# Patient Record
Sex: Female | Born: 1950 | Hispanic: No | Marital: Single | Smoking: Former smoker
Health system: Southern US, Community
[De-identification: ages and names within clinical notes are randomized; demographics above are authoritative.]

## PROBLEM LIST (undated history)

## (undated) DIAGNOSIS — I10 Essential (primary) hypertension: Secondary | ICD-10-CM

## (undated) DIAGNOSIS — K529 Noninfective gastroenteritis and colitis, unspecified: Secondary | ICD-10-CM

## (undated) DIAGNOSIS — F419 Anxiety disorder, unspecified: Secondary | ICD-10-CM

## (undated) DIAGNOSIS — E785 Hyperlipidemia, unspecified: Secondary | ICD-10-CM

## (undated) HISTORY — DX: Noninfective gastroenteritis and colitis, unspecified: K52.9

## (undated) HISTORY — DX: Hyperlipidemia, unspecified: E78.5

## (undated) HISTORY — DX: Anxiety disorder, unspecified: F41.9

## (undated) HISTORY — PX: COLONOSCOPY: SHX174

---

## 1997-09-01 ENCOUNTER — Other Ambulatory Visit: Admission: RE | Admit: 1997-09-01 | Discharge: 1997-09-01 | Payer: Self-pay | Admitting: Obstetrics and Gynecology

## 1998-04-09 ENCOUNTER — Emergency Department (HOSPITAL_COMMUNITY): Admission: EM | Admit: 1998-04-09 | Discharge: 1998-04-09 | Payer: Self-pay | Admitting: Emergency Medicine

## 1998-09-17 ENCOUNTER — Other Ambulatory Visit: Admission: RE | Admit: 1998-09-17 | Discharge: 1998-09-17 | Payer: Self-pay | Admitting: Obstetrics and Gynecology

## 2001-01-04 ENCOUNTER — Other Ambulatory Visit: Admission: RE | Admit: 2001-01-04 | Discharge: 2001-01-04 | Payer: Self-pay | Admitting: Obstetrics and Gynecology

## 2002-10-12 ENCOUNTER — Other Ambulatory Visit: Admission: RE | Admit: 2002-10-12 | Discharge: 2002-10-12 | Payer: Self-pay | Admitting: Obstetrics and Gynecology

## 2004-07-18 ENCOUNTER — Other Ambulatory Visit: Admission: RE | Admit: 2004-07-18 | Discharge: 2004-07-18 | Payer: Self-pay | Admitting: Obstetrics and Gynecology

## 2004-12-02 ENCOUNTER — Encounter (INDEPENDENT_AMBULATORY_CARE_PROVIDER_SITE_OTHER): Payer: Self-pay | Admitting: Specialist

## 2004-12-02 ENCOUNTER — Ambulatory Visit (HOSPITAL_COMMUNITY): Admission: RE | Admit: 2004-12-02 | Discharge: 2004-12-02 | Payer: Self-pay | Admitting: Gastroenterology

## 2004-12-02 DIAGNOSIS — K529 Noninfective gastroenteritis and colitis, unspecified: Secondary | ICD-10-CM

## 2004-12-02 HISTORY — DX: Noninfective gastroenteritis and colitis, unspecified: K52.9

## 2007-07-15 ENCOUNTER — Emergency Department (HOSPITAL_COMMUNITY): Admission: EM | Admit: 2007-07-15 | Discharge: 2007-07-15 | Payer: Self-pay | Admitting: Emergency Medicine

## 2008-10-13 ENCOUNTER — Emergency Department (HOSPITAL_COMMUNITY): Admission: EM | Admit: 2008-10-13 | Discharge: 2008-10-13 | Payer: Self-pay | Admitting: Family Medicine

## 2010-06-09 LAB — POCT URINALYSIS DIP (DEVICE)
Bilirubin Urine: NEGATIVE
Glucose, UA: NEGATIVE mg/dL
Ketones, ur: NEGATIVE mg/dL
Nitrite: POSITIVE — AB
Specific Gravity, Urine: 1.01 (ref 1.005–1.030)
pH: 6.5 (ref 5.0–8.0)

## 2010-06-09 LAB — WET PREP, GENITAL
Clue Cells Wet Prep HPF POC: NONE SEEN
Trich, Wet Prep: NONE SEEN
Yeast Wet Prep HPF POC: NONE SEEN

## 2010-06-09 LAB — GC/CHLAMYDIA PROBE AMP, GENITAL: Chlamydia, DNA Probe: NEGATIVE

## 2010-07-19 NOTE — Op Note (Signed)
Lindsey Krause, Lindsey Krause             ACCOUNT NO.:  [id number]   MEDICAL RECORD NO.:  [id number]          PATIENT TYPE:  AMB   LOCATION:  ENDO                         FACILITY:  [facility]   PHYSICIAN:  Anselmo Rod, M.D.  DATE OF BIRTH:  18-Apr-1950   DATE OF PROCEDURE:  12/02/2004  DATE OF DISCHARGE:                                 OPERATIVE REPORT   PROCEDURE:  Colonoscopy with cold biopsies x8.   ENDOSCOPIST:  Anselmo Rod, M.D.   INSTRUMENT USED:  Olympus video colonoscope.   INDICATIONS FOR PROCEDURE:  A 60 year old African-American female with a  history of chronic constipation and family history of colon cancer  undergoing a screening colonoscopy, rule out colonic polyps, masses, etc.   PREPROCEDURE PREPARATION:  Informed consent was procured from the patient.  The patient was fasted for 8 hours prior to the procedure and prepped with a  bottle of magnesium citrate and a gallon of GoLYTELY the night prior to the  procedure.  Risks and benefits of the procedure including a 10% miss rate of  cancer and polyp was discussed with the patient as well.   PREPROCEDURE PHYSICAL EXAMINATION:  VITAL SIGNS:  Stable.  NECK:  Supple.  CHEST:  Clear to auscultation.  S1 and S2 regular.  ABDOMEN:  Soft with normal bowel sounds.   DESCRIPTION OF PROCEDURE:  The patient was placed in the left lateral  decubitus position and sedated with 100 mg of Demerol and 7.5 mg of Versed  in slow incremental doses.  Once the patient was adequately sedated and  maintained on low flow oxygen and continuous cardiac monitoring.  The  Olympus video colonoscope was advanced from the rectum to the cecum.  The  appendiceal orifice and ileocecal valve are visualized and photographed.  Erosions were noted in the cecum with some nodular changes in the  surrounding mucosa.  These were biopsied for pathology.  The terminal ileum  appeared normal and without lesions.  There was some loss of vascular  pattern with  bleeding noted around in the rectosigmoid colon at 15 cm.  Biopsies were done to rule out dysplasia versus malignancy.  There was no  evidence of diverticulosis.  Retroflexion in the rectum revealed no  abnormalities.  The patient tolerated the procedure well without immediate  complications.   IMPRESSION:  1.Loss of vascular pattern with some fresh blood noted at 15 cm  for reasons not clear to me.  Biopsies were done at this area.  2.Erosions biopsied from the cecum.  3.Normal to the terminal ileum.  4.No masses, polyps, or diverticula seen.   RECOMMENDATIONS:  Await pathology results.  Avoid all nonsteroidals for now.  Outpatient follow-up in the next 2 weeks for further recommendations.      Anselmo Rod, M.D.  Electronically Signed     JNM/MEDQ  D:  12/02/2004  T:  12/02/2004  Job:  [id number]   cc:   Candyce Churn. Allyne Gee, M.D.  Fax: [phone]   Janine Limbo, M.D.  Fax: [phone]

## 2012-08-30 DIAGNOSIS — E049 Nontoxic goiter, unspecified: Secondary | ICD-10-CM | POA: Insufficient documentation

## 2013-07-26 DIAGNOSIS — I10 Essential (primary) hypertension: Secondary | ICD-10-CM | POA: Insufficient documentation

## 2014-09-12 DIAGNOSIS — E559 Vitamin D deficiency, unspecified: Secondary | ICD-10-CM | POA: Insufficient documentation

## 2014-09-12 DIAGNOSIS — Z0000 Encounter for general adult medical examination without abnormal findings: Secondary | ICD-10-CM | POA: Insufficient documentation

## 2015-04-04 ENCOUNTER — Encounter (HOSPITAL_COMMUNITY): Payer: Self-pay | Admitting: *Deleted

## 2015-04-04 ENCOUNTER — Emergency Department (HOSPITAL_COMMUNITY): Payer: BC Managed Care – PPO

## 2015-04-04 ENCOUNTER — Emergency Department (HOSPITAL_COMMUNITY)
Admission: EM | Admit: 2015-04-04 | Discharge: 2015-04-05 | Discharge status: Against Medical Advice | Payer: BC Managed Care – PPO | Attending: Emergency Medicine | Admitting: Emergency Medicine

## 2015-04-04 DIAGNOSIS — R51 Headache: Secondary | ICD-10-CM | POA: Diagnosis not present

## 2015-04-04 DIAGNOSIS — Z973 Presence of spectacles and contact lenses: Secondary | ICD-10-CM | POA: Insufficient documentation

## 2015-04-04 DIAGNOSIS — H578 Other specified disorders of eye and adnexa: Secondary | ICD-10-CM | POA: Diagnosis present

## 2015-04-04 DIAGNOSIS — I10 Essential (primary) hypertension: Secondary | ICD-10-CM | POA: Insufficient documentation

## 2015-04-04 DIAGNOSIS — H5462 Unqualified visual loss, left eye, normal vision right eye: Secondary | ICD-10-CM | POA: Insufficient documentation

## 2015-04-04 HISTORY — DX: Essential (primary) hypertension: I10

## 2015-04-04 LAB — COMPREHENSIVE METABOLIC PANEL
ALBUMIN: 3.8 g/dL (ref 3.5–5.0)
ALT: 17 U/L (ref 14–54)
AST: 21 U/L (ref 15–41)
Alkaline Phosphatase: 78 U/L (ref 38–126)
Anion gap: 11 (ref 5–15)
BILIRUBIN TOTAL: 0.2 mg/dL — AB (ref 0.3–1.2)
BUN: 12 mg/dL (ref 6–20)
CHLORIDE: 102 mmol/L (ref 101–111)
CO2: 28 mmol/L (ref 22–32)
CREATININE: 0.91 mg/dL (ref 0.44–1.00)
Calcium: 9.8 mg/dL (ref 8.9–10.3)
GFR calc Af Amer: >60 mL/min (ref >60)
GLUCOSE: 109 mg/dL — AB (ref 65–99)
POTASSIUM: 4 mmol/L (ref 3.5–5.1)
Sodium: 141 mmol/L (ref 135–145)
Total Protein: 7.2 g/dL (ref 6.5–8.1)

## 2015-04-04 LAB — I-STAT CHEM 8, ED
BUN: 15 mg/dL (ref 6–20)
CHLORIDE: 103 mmol/L (ref 101–111)
CREATININE: 0.9 mg/dL (ref 0.44–1.00)
Calcium, Ion: 1.18 mmol/L (ref 1.13–1.30)
Glucose, Bld: 105 mg/dL — ABNORMAL HIGH (ref 65–99)
HEMATOCRIT: 42 % (ref 36.0–46.0)
Hemoglobin: 14.3 g/dL (ref 12.0–15.0)
Potassium: 3.9 mmol/L (ref 3.5–5.1)
SODIUM: 140 mmol/L (ref 135–145)
TCO2: 26 mmol/L (ref 0–100)

## 2015-04-04 LAB — PROTIME-INR
INR: 1.03 (ref 0.00–1.49)
PROTHROMBIN TIME: 13.7 s (ref 11.6–15.2)

## 2015-04-04 LAB — CBG MONITORING, ED: Glucose-Capillary: 88 mg/dL (ref 65–99)

## 2015-04-04 LAB — CBC
HEMATOCRIT: 39.1 % (ref 36.0–46.0)
HEMOGLOBIN: 12.9 g/dL (ref 12.0–15.0)
MCH: 27.9 pg (ref 26.0–34.0)
MCHC: 33 g/dL (ref 30.0–36.0)
MCV: 84.4 fL (ref 78.0–100.0)
Platelets: 233 10*3/uL (ref 150–400)
RBC: 4.63 MIL/uL (ref 3.87–5.11)
RDW: 12.5 % (ref 11.5–15.5)
WBC: 7 10*3/uL (ref 4.0–10.5)

## 2015-04-04 LAB — I-STAT TROPONIN, ED: TROPONIN I, POC: 0 ng/mL (ref 0.00–0.08)

## 2015-04-04 LAB — DIFFERENTIAL
BASOS ABS: 0 10*3/uL (ref 0.0–0.1)
BASOS PCT: 0 %
EOS ABS: 0.3 10*3/uL (ref 0.0–0.7)
Eosinophils Relative: 4 %
LYMPHS ABS: 3.1 10*3/uL (ref 0.7–4.0)
Lymphocytes Relative: 44 %
MONOS PCT: 4 %
Monocytes Absolute: 0.3 10*3/uL (ref 0.1–1.0)
NEUTROS ABS: 3.3 10*3/uL (ref 1.7–7.7)
NEUTROS PCT: 48 %

## 2015-04-04 LAB — APTT: APTT: 33 s (ref 24–37)

## 2015-04-04 LAB — SEDIMENTATION RATE: Sed Rate: 33 mm/hr — ABNORMAL HIGH (ref 0–22)

## 2015-04-04 NOTE — ED Provider Notes (Signed)
By signing my name below, I, Rohini Rajnarayanan, attest that this documentation has been prepared under the direction and in the presence of [place], DO Electronically Signed: Evonnie Dawes, ED Scribe 04/04/2015 at 11:17 PM.  TIME SEEN: 1:05 AM  CHIEF COMPLAINT:  Chief Complaint  Patient presents with  . Eye Problem    HPI: HPI Comments: Lindsey Krause is a 65 y.o. female with a PMhx of HTN, who presents to the Emergency Department complaining of intermittent blindness in the left eye which began around 6pm today. The last episode was about 8:30PM. Episode began as blurriness and then complete loss of vision in one eye. Each episode lasts around 10 minutes each. Pt denies any similar prior hx. Pt has no numbness or weakness on one side of the body. She reported to triage nursing staff that she did have some mild pain over the left temple but denies this to me. Pt has no pmhx of temporal arteritis, autoimmune diseases, or TIA/CVA.  Pt denies any fatigue or pain in the jaw while eating. Pt denies any SOB or CP. Pt has not seen an opthalmologist in several years. Pt wears glasses.  Pt does not smoke. Pt takes no medication for HTN.  Denies headache currently. No eye pain. No tearing. No trauma to the eye or head. Not on anticoagulation. No vomiting.   ROS: See HPI Constitutional: no fever  Eyes: no drainage, +vision changes ENT: no runny nose  Cardiovascular:  no chest pain  Resp: no SOB  GI: no vomiting GU: no dysuria Integumentary: no rash  Allergy: no hives  Musculoskeletal: no leg swelling  Neurological: no slurred speech ROS otherwise negative  PAST MEDICAL HISTORY/PAST SURGICAL HISTORY:  Past Medical History  Diagnosis Date  . Hypertension     MEDICATIONS:  Prior to Admission medications   Not on File    ALLERGIES:  Allergies not on file  SOCIAL HISTORY:  Social History  Substance Use Topics  . Smoking status: Never Smoker   . Smokeless tobacco: Not on  file  . Alcohol Use: No    FAMILY HISTORY: No family history on file.  EXAM:  BP 165/83 mmHg  Pulse 60  Temp(Src) 97.7 F (36.5 C) (Oral)  Resp 18  Ht _0  (1.626 m)  Wt 150 lb (68.04 kg)  BMI 25.73 kg/m2  SpO2 100% CONSTITUTIONAL: Alert and oriented and responds appropriately to questions. Well-appearing; well-nourished HEAD: Normocephalic, no Tenderness over the left temporal artery, no erythema or warmth.  EYES: Conjunctivae clear, PERRL, EOM intact; fundoscopic exam limited due to patient cooperation, no hyphema or hypopyon, no subconjunctival hemorrhage, no eye discharge, normal visual fields ENT: normal nose; no rhinorrhea; moist mucous membranes; pharynx without lesions noted NECK: Supple, no meningismus, no LAD  CARD: RRR; S1 and S2 appreciated; no murmurs, no clicks, no rubs, no gallops RESP: Normal chest excursion without splinting or tachypnea; breath sounds clear and equal bilaterally; no wheezes, no rhonchi, no rales, no hypoxia or respiratory distress, speaking full sentences ABD/GI: Normal bowel sounds; non-distended; soft, non-tender, no rebound, no guarding, no peritoneal signs BACK:  The back appears normal and is non-tender to palpation, there is no CVA tenderness EXT: Normal ROM in all joints; non-tender to palpation; no edema; normal capillary refill; no cyanosis, no calf tenderness or swelling    SKIN: Normal color for age and race; warm NEURO: Moves all extremities equally, sensation to light touch intact diffusely, cranial nerves II through XII intact. Strenght 5/5 in all  4 extremities. Normal gait. No dysarthria or aphasia. PSYCH: The patient's mood and manner are appropriate. Grooming and personal hygiene are appropriate.   MEDICAL DECISION MAKING: Patient here with complaints of vision loss in the left eye twice. Vision is now back to normal. No other neurologic deficits. Patient's labs ordered in triage had been unremarkable except for mildly elevated ESR.  CT of her head shows no acute intracranial abnormality. Discussed with patient that I'm concerned for amaurosis fugax, less likely temporal arteritis, TIA. Patient is very upset over her weight. Have apologized multiple times. Patient is difficult to redirect. She agrees to wait until I'm able to discuss her case with Dr. Nicole Kindred, on-call neuro hospitalist.  ED PROGRESS: Discussed with Dr. Nicole Kindred who recommend CT angios of patient's head and cervical spine to look at her current arteries. He recommends starting patient on aspirin. She refuses to have any more workup done in the emergency department and states that she wants to see her primary care physician. Refuses to start taking aspirin. I have had a long discussion with patient that I feel that she needs further workup emergently again patient refuses. She is oriented and has capacity to make this decision for herself. She does not appear intoxicated. I discussed with patient that she could end up with long-term disability and even death without further workup. Have recommended she return to emergency department immediately if her symptoms return or she changes her mind. She does have a primary care physician for follow-up. We'll have her sign out [place]. Discussed return precautions. She verbalized understanding.    EKG Interpretation  Date/Time:  Wednesday April 04 2015 22:14:05 EST Ventricular Rate:  66 PR Interval:  154 QRS Duration: 100 QT Interval:  424 QTC Calculation: 444 R Axis:   88 Text Interpretation:   Poor data quality, interpretation may be adversely affected Sinus rhythm Incomplete right bundle branch block Borderline ECG No old tracing to compare Confirmed by [facility]  MD, DAVID ([id number]) on 04/04/2015 10:25:23 PM         I personally performed the services described in this documentation, which was scribed in my presence. The recorded information has been reviewed and is accurate.    [place],  DO 04/05/15 [phone]

## 2015-04-04 NOTE — ED Notes (Signed)
CBG was 88

## 2015-04-04 NOTE — ED Notes (Signed)
Pt refused lab work.

## 2015-04-04 NOTE — ED Notes (Addendum)
Pt says that around 6pm and again at around 9pm, lasting for about 10 minutes, pt says that through her left eye her vision "fades out". She says she see's light, outlines, then "nothing". Now her vision has returned to normal. She c/o a small amount of pain over the left temporal area (now subsided)

## 2015-04-05 NOTE — ED Notes (Signed)
Dr. Leonides Schanz at bedside speaking with pt.

## 2015-04-06 DIAGNOSIS — H53129 Transient visual loss, unspecified eye: Secondary | ICD-10-CM | POA: Insufficient documentation

## 2015-06-12 DIAGNOSIS — H40003 Preglaucoma, unspecified, bilateral: Secondary | ICD-10-CM | POA: Insufficient documentation

## 2015-06-14 DIAGNOSIS — F419 Anxiety disorder, unspecified: Secondary | ICD-10-CM | POA: Insufficient documentation

## 2016-01-14 DIAGNOSIS — R1032 Left lower quadrant pain: Secondary | ICD-10-CM | POA: Insufficient documentation

## 2017-02-24 IMAGING — CT CT HEAD W/O CM
2 series · 16 of 30 positions shown, 20 images · non-contrast
Comparison: None.

CLINICAL DATA: Two episodes of transient vision loss in the left
eye tonight.

EXAM:
CT HEAD WITHOUT CONTRAST
TECHNIQUE: Contiguous axial images were obtained from the base of the skull
through the vertex without intravenous contrast.

[Series 201: head w/o, idose (1) · axial · non-contrast · 0.41mm/px · z∈[+124,+254]mm · 13 of 32 slices shown, 17 images]
[im 3/32  brain]
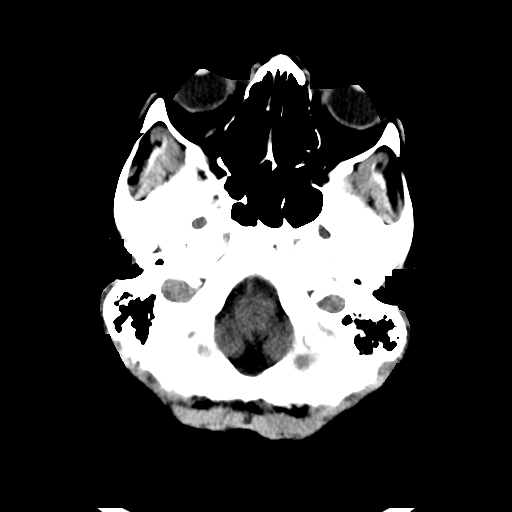
[im 3/32  bone]
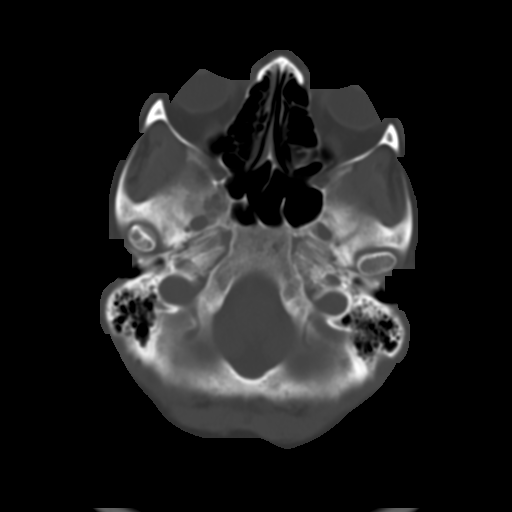
[im 5/32  brain]
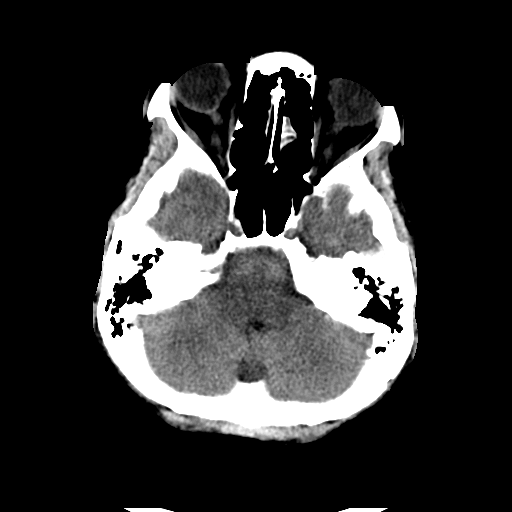
[im 7/32  brain]
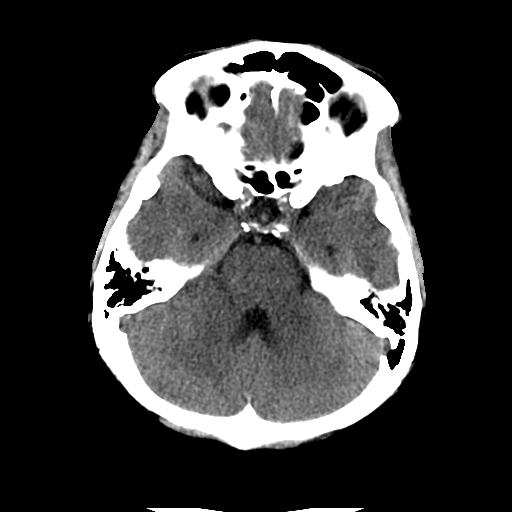
[im 9/32  brain]
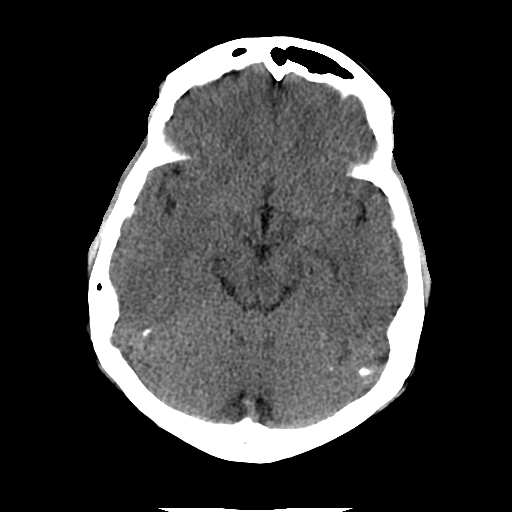
[im 12/32  brain]
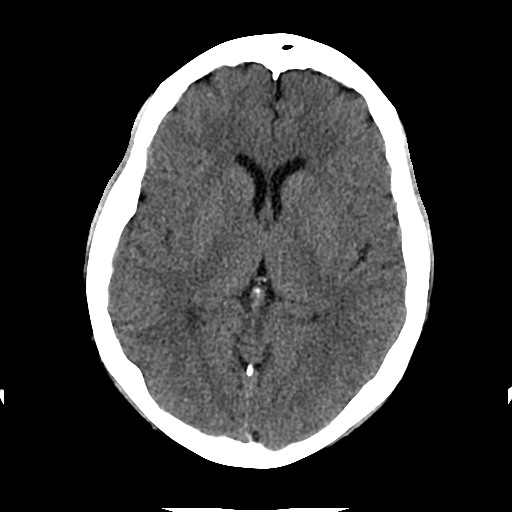
[im 12/32  bone]
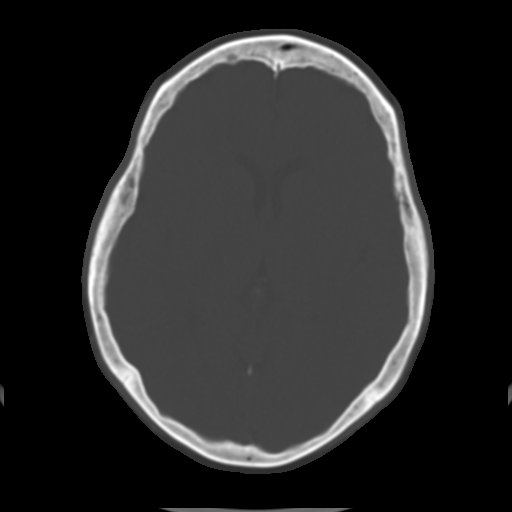
[im 14/32  brain]
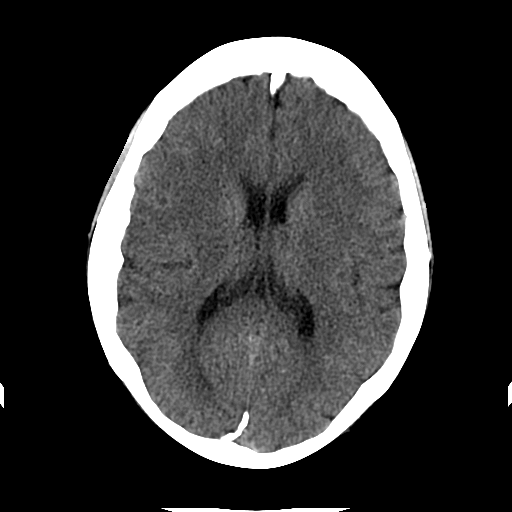
[im 16/32  brain]
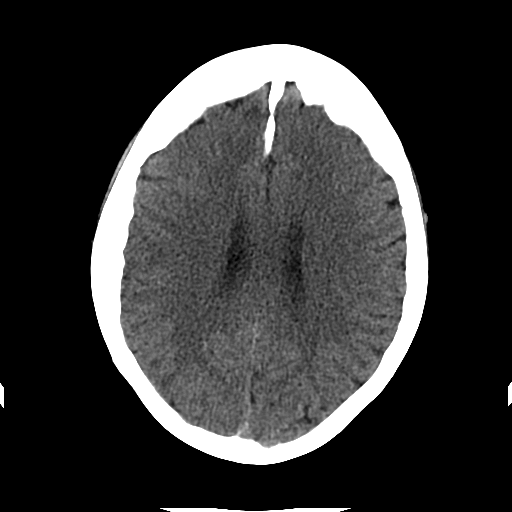
[im 18/32  brain]
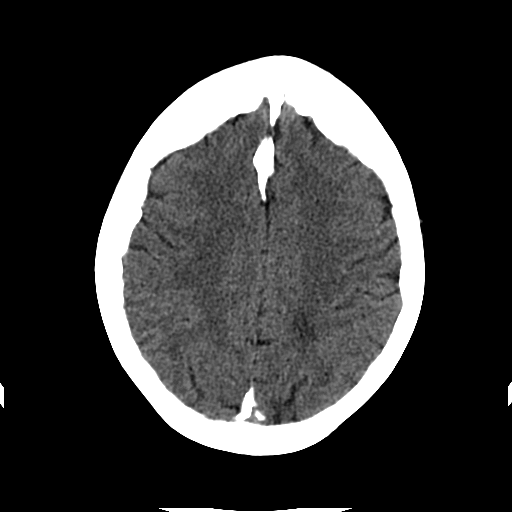
[im 20/32  brain]
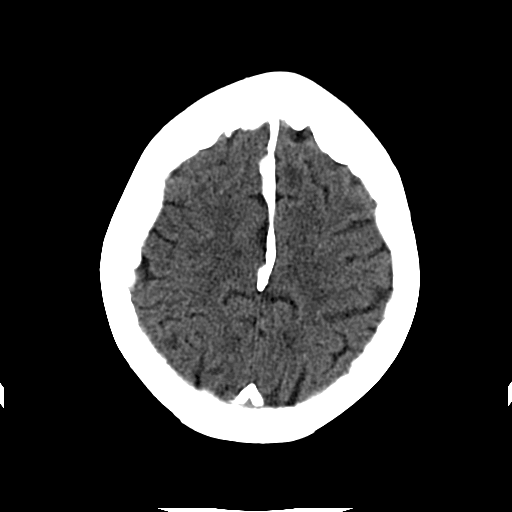
[im 20/32  bone]
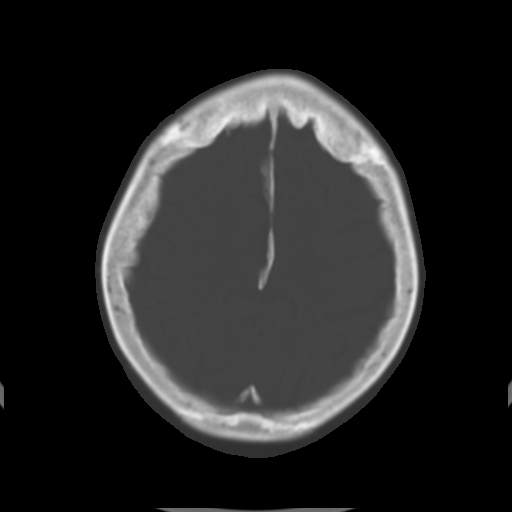
[im 23/32  brain]
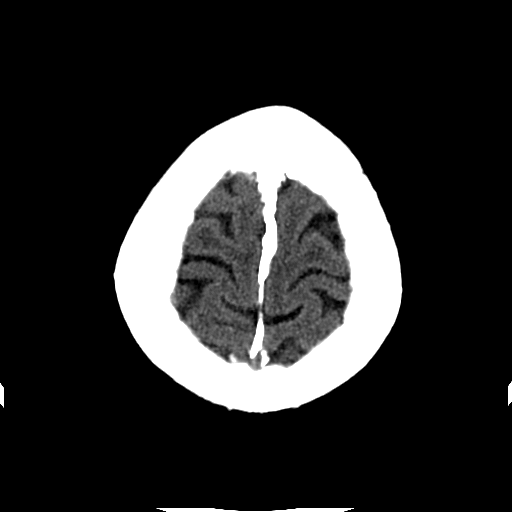
[im 25/32  brain]
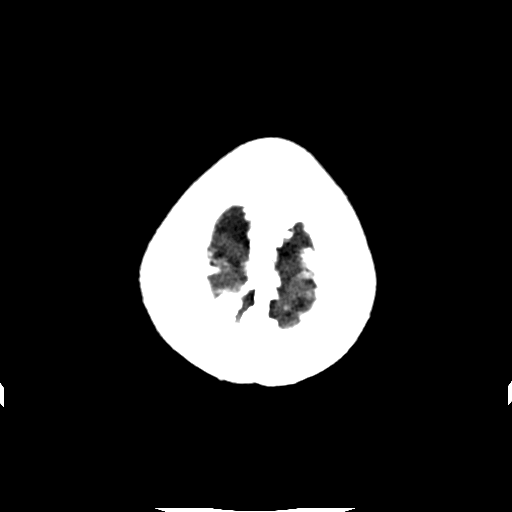
[im 27/32  brain]
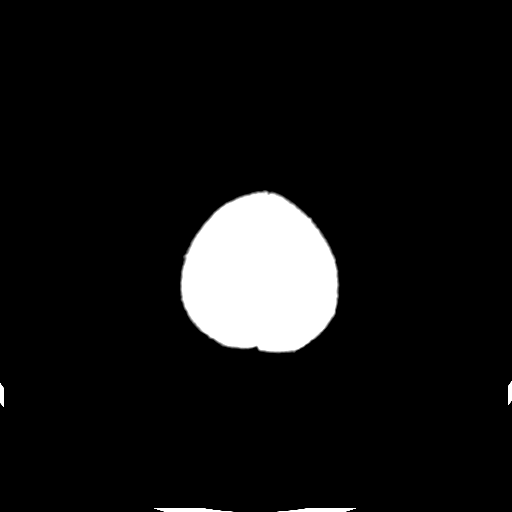
[im 29/32  brain]
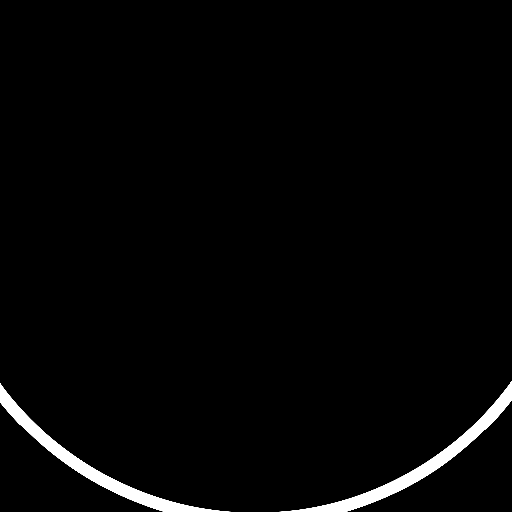
[im 29/32  bone]
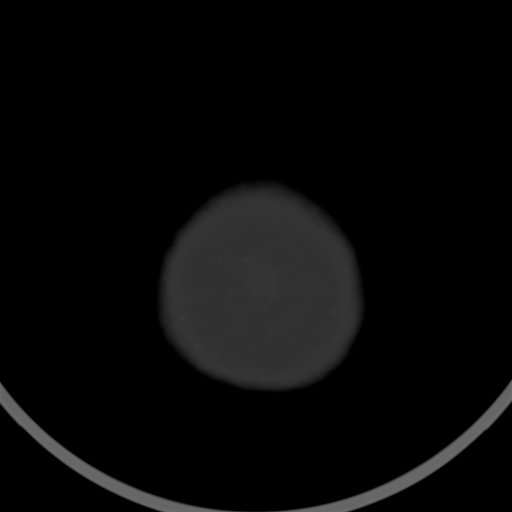

[Series 202: head w/o bone, idose (1) · axial · non-contrast · 0.41mm/px · z∈[+124,+169]mm · 3 of 32 slices shown]
[im 3/32  bone]
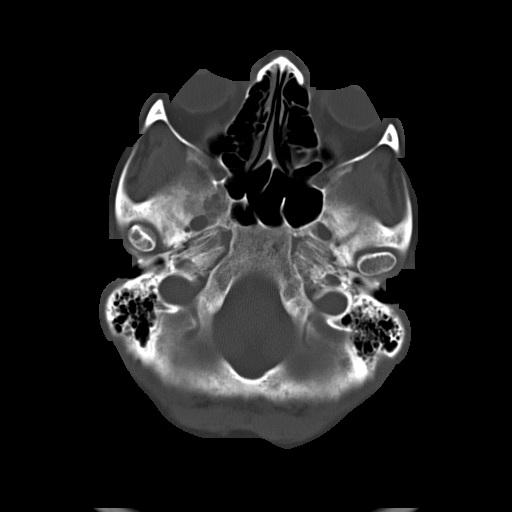
[im 7/32  bone]
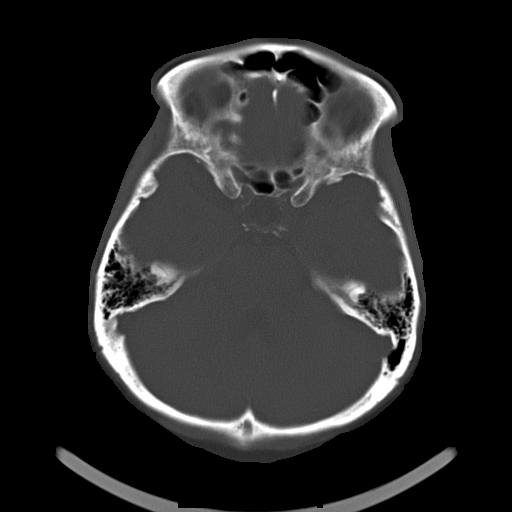
[im 12/32  bone]
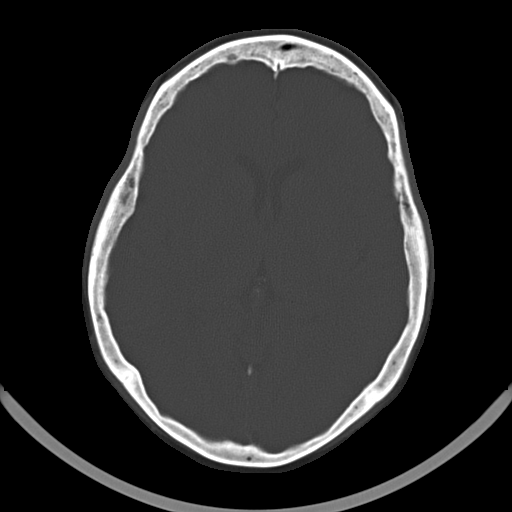

[16 of 30 positions shown; findings below may reference images not displayed]

FINDINGS: No intracranial hemorrhage, mass effect, or midline shift. No
hydrocephalus. The basilar cisterns are patent. Mild chronic small
vessel ischemia. No evidence of territorial infarct. No intracranial
fluid collection. Falx and dural-based calcification along the
tentorium. Calvarium is intact. Included paranasal sinuses and
mastoid air cells are well aerated.
IMPRESSION: No acute intracranial abnormality.

## 2017-03-04 DIAGNOSIS — D259 Leiomyoma of uterus, unspecified: Secondary | ICD-10-CM | POA: Insufficient documentation

## 2017-03-06 ENCOUNTER — Other Ambulatory Visit: Payer: Self-pay | Admitting: Family Medicine

## 2017-03-06 DIAGNOSIS — E010 Iodine-deficiency related diffuse (endemic) goiter: Secondary | ICD-10-CM

## 2017-03-26 ENCOUNTER — Ambulatory Visit
Admission: RE | Admit: 2017-03-26 | Discharge: 2017-03-26 | Disposition: A | Discharge status: Home / Self Care | Payer: BC Managed Care – PPO | Source: Ambulatory Visit | Attending: Family Medicine | Admitting: Family Medicine

## 2017-03-26 DIAGNOSIS — E010 Iodine-deficiency related diffuse (endemic) goiter: Secondary | ICD-10-CM

## 2017-05-07 ENCOUNTER — Encounter: Payer: Self-pay | Admitting: Family Medicine

## 2017-08-18 ENCOUNTER — Encounter: Payer: Self-pay | Admitting: Endocrinology

## 2017-08-20 ENCOUNTER — Encounter: Payer: Self-pay | Admitting: Internal Medicine

## 2017-09-17 DIAGNOSIS — R0602 Shortness of breath: Secondary | ICD-10-CM | POA: Insufficient documentation

## 2017-10-20 ENCOUNTER — Ambulatory Visit: Payer: BC Managed Care – PPO | Admitting: Internal Medicine

## 2018-02-01 ENCOUNTER — Encounter: Payer: Self-pay | Admitting: Internal Medicine

## 2018-03-04 ENCOUNTER — Encounter: Payer: Self-pay | Admitting: *Deleted

## 2018-03-12 ENCOUNTER — Ambulatory Visit: Payer: BC Managed Care – PPO | Admitting: Internal Medicine

## 2018-03-12 ENCOUNTER — Encounter

## 2018-03-12 ENCOUNTER — Telehealth: Payer: Self-pay | Admitting: *Deleted

## 2018-03-12 ENCOUNTER — Encounter: Payer: Self-pay | Admitting: Internal Medicine

## 2018-03-12 VITALS — BP 138/70 | HR 64 | Ht 64.0 in | Wt 161.6 lb

## 2018-03-12 DIAGNOSIS — Z1211 Encounter for screening for malignant neoplasm of colon: Secondary | ICD-10-CM

## 2018-03-12 DIAGNOSIS — R109 Unspecified abdominal pain: Secondary | ICD-10-CM | POA: Diagnosis not present

## 2018-03-12 MED ORDER — HYOSCYAMINE SULFATE 0.125 MG SL SUBL: 0.1250 mg | SUBLINGUAL_TABLET | Freq: Four times a day (QID) | SUBLINGUAL | 1 refills | Status: DC | PRN

## 2018-03-12 MED ORDER — SUPREP BOWEL PREP KIT 17.5-3.13-1.6 GM/177ML PO SOLN: 1.0000 | ORAL | 0 refills | Status: DC

## 2018-03-12 NOTE — Patient Instructions (Signed)
You have been scheduled for a colonoscopy. Please follow written instructions given to you at your visit today.  Please pick up your prep supplies at the pharmacy within the next 1-3 days. If you use inhalers (even only as needed), please bring them with you on the day of your procedure. Your physician has requested that you go to www.startemmi.com and enter the access code given to you at your visit today. This web site gives a general overview about your procedure. However, you should still follow specific instructions given to you by our office regarding your preparation for the procedure.  If you are age 15 or older, your body mass index should be between 23-30. Your Body mass index is 27.74 kg/m. If this is out of the aforementioned range listed, please consider follow up with your Primary Care Provider.  If you are age 68 or younger, your body mass index should be between 19-25. Your Body mass index is 27.74 kg/m. If this is out of the aformentioned range listed, please consider follow up with your Primary Care Provider.

## 2018-03-12 NOTE — Progress Notes (Signed)
Patient ID: Lindsey Krause, female   DOB: 06/03/1950, 68 y.o.   MRN: [id number] HPI: Lindsey Krause is a 68 year old female with a history of hypertension who is seen in consultation at the request of Dr. Luciana Axe to evaluate left-sided abdominal pain but also to consider colon cancer screening.  She is here alone today.  This is our first visit together.  She reports that she has been having episodes of left-sided, particularly lower abdominal pain over the last 3 years.  Initially this was associated with some urinary issues and she was diagnosed with a urinary tract infection.  She was treated with antibiotics and it cleared up both her left-sided abdominal pain and her urinary tract infection.  Again sometime later symptoms restarted and happened to correspond to a visit with her gynecologist.  Diagnosed again with UTI, received antibiotics and they improved.  6 months ago left-sided abdominal pain again she had some leftover antibiotics which she took, without being a complete course and it helped.  These episodes seem to occur every 3 to 6 months.  Cycles can last for as long as a month.  Bowel movements have been regular though prior to episodes at times have been small and harder.  No blood in her stool or melena.  Most recently bowels are regular occurring once per day.  No diarrhea or constipation.  Her appetite has been good and weight stable.  She was having some issues with heartburn on occasion but this is been better when she eliminated coffee from her diet.  Episodes can be associated with a low grade nausea without vomiting.  No dysphagia or odynophagia.  She feels that she is improving currently from what she would consider a mild episode.  She has not had any urinary symptoms of late and did not take antibiotics recently.  She does take a daily fiber supplement and a probiotic and an herbal preparation containing the word "claw".  She also uses external heat which she feels strongly helps the  discomfort.  She had a negative Cologuard 3 years ago.  Remotely she had a colonoscopy performed by Dr. Collene Mares on 12/02/2004.  I reviewed this procedure including pathology personally and also with the patient.  Colonoscopy showed erosions in the cecum with nodular change which was biopsied.  A normal terminal ileum without lesions.  Loss of vascular pattern with bleeding at the rectosigmoid colon at 15 cm.  This was biopsied.  There was no diverticulosis seen in her retroflexion views from the rectum were normal.  Pathology = cecum focal active colitis.  No granulomas or crypt distortion.  Differential includes resolving infectious colitis, Crohn's and NSAID.  Rectosigmoid biopsies hyperemia with microhemorrhages.  In the rectosigmoid there was no active inflammation or chronic changes of granuloma.  Nonspecific scope, query scope trauma or bowel preparation.  Past Medical History:  Diagnosis Date  . Anxiety   . Colitis 12/02/2004   Dr Collene Mares "focal active colitis"  . Hypertension     Past Surgical History:  Procedure Laterality Date  . COLONOSCOPY      Outpatient Medications Prior to Visit  Medication Sig Dispense Refill  . amlodipine-benazepril (LOTREL) 2.5-10 MG capsule Take 1 capsule by mouth daily.    . cholecalciferol (VITAMIN D) 1000 units tablet Take 1,000 Units by mouth daily.     No facility-administered medications prior to visit.     Allergies  Allergen Reactions  . Aspirin Swelling  . Codeine Swelling    Family History  Problem Relation  Age of Onset  . Heart disease Father   . Diabetes Sister   . Diabetes Maternal Grandmother   . Colon cancer Cousin   . Breast cancer Other     Social History   Tobacco Use  . Smoking status: Never Smoker  . Smokeless tobacco: Never Used  Substance Use Topics  . Alcohol use: No  . Drug use: No    ROS: As per history of present illness, otherwise negative  BP 138/70   Pulse 64   Ht 5\' 4"  (1.626 m)   Wt 161 lb 9.6 oz  (73.3 kg)   SpO2 100%   BMI 27.74 kg/m  Constitutional: Well-developed and well-nourished. No distress. HEENT: Normocephalic and atraumatic.  No scleral icterus. Neck: Neck supple. Trachea midline. Cardiovascular: Normal rate, regular rhythm and intact distal pulses. No M/R/G Pulmonary/chest: Effort normal and breath sounds normal. No wheezing, rales or rhonchi. Abdominal: Soft, mild left lower tenderness without rebound or guarding, nondistended. Bowel sounds active throughout. There are no masses palpable. No hepatosplenomegaly. Extremities: no clubbing, cyanosis, or edema Neurological: Alert and oriented to person place and time. Psychiatric: Normal mood and affect. Behavior is normal.  RELEVANT LABS AND IMAGING: CBC    Component Value Date/Time   WBC 7.0 04/04/2015 2221   RBC 4.63 04/04/2015 2221   HGB 14.3 04/04/2015 2222   HCT 42.0 04/04/2015 2222   PLT 233 04/04/2015 2221   MCV 84.4 04/04/2015 2221   MCH 27.9 04/04/2015 2221   MCHC 33.0 04/04/2015 2221   RDW 12.5 04/04/2015 2221   LYMPHSABS 3.1 04/04/2015 2221   MONOABS 0.3 04/04/2015 2221   EOSABS 0.3 04/04/2015 2221   BASOSABS 0.0 04/04/2015 2221    CMP     Component Value Date/Time   NA 140 04/04/2015 2222   K 3.9 04/04/2015 2222   CL 103 04/04/2015 2222   CO2 28 04/04/2015 2221   GLUCOSE 105 (H) 04/04/2015 2222   BUN 15 04/04/2015 2222   CREATININE 0.90 04/04/2015 2222   CALCIUM 9.8 04/04/2015 2221   PROT 7.2 04/04/2015 2221   ALBUMIN 3.8 04/04/2015 2221   AST 21 04/04/2015 2221   ALT 17 04/04/2015 2221   ALKPHOS 78 04/04/2015 2221   BILITOT 0.2 (L) 04/04/2015 2221   GFRNONAA >60 04/04/2015 2221   GFRAA >60 04/04/2015 2221    ASSESSMENT/PLAN: 68 year old female with a history of hypertension who is seen in consultation at the request of Dr. Luciana Axe to evaluate left-sided abdominal pain but also to consider colon cancer screening.  1. CRC screening --last Cologuard -3 years ago.  Colorectal cancer  screening due at this time.  I recommended proceeding with colonoscopy.  We discussed the risk, benefits and alternatives and she is agreeable and wishes to proceed.  2.  Intermittent/episodic left lower quadrant abdominal pain --is interesting and notable that symptoms seem to improve with antibiotics.  This raises suspicion for low level diverticular inflammation versus infectious etiology.  The findings from colonoscopy in 2008 are also notable given erythema seen at the rectosigmoid and also cecum.  This is another reason to repeat the colonoscopy to further evaluate the symptoms.  Exam was not significantly tender to suggest active diverticulitis.  I recommend she continue her fiber preparation to help promote regular and complete bowel movement.  Levsin 0.125 every 6 hours can be used as needed for crampy abdominal pain.     BT:DVVO, Dola Factor, [place], [place] [address]

## 2018-03-12 NOTE — Addendum Note (Signed)
Addended by: Larina Bras on: 03/12/2018 10:34 AM   Modules accepted: Orders

## 2018-03-12 NOTE — Telephone Encounter (Signed)
Patient asked if Dr Hilarie Fredrickson had any additional suggestions regarding things to help with her abdominal discomfort prior to her colonoscopy 04/08/2018. Dr Hilarie Fredrickson states he would like patient to continue taking her fiber and has also offered levsin SL 0.125 mg  Every 6 hours as needed if she would like to try this. Patient indicates that she would like the levsin sent to the pharmacy and will take it if she feels she needs it. Rx has been sent.

## 2018-03-25 ENCOUNTER — Encounter: Payer: Self-pay | Admitting: Internal Medicine

## 2018-04-08 ENCOUNTER — Encounter: Payer: BC Managed Care – PPO | Admitting: Internal Medicine

## 2018-04-27 ENCOUNTER — Other Ambulatory Visit: Payer: Self-pay

## 2018-04-27 ENCOUNTER — Telehealth: Payer: Self-pay | Admitting: Internal Medicine

## 2018-04-27 NOTE — Telephone Encounter (Signed)
Pt is following back up on previous msg. has not heard anything back and has to take it tomorrow.

## 2018-04-27 NOTE — Telephone Encounter (Signed)
Attempted to reach patient. Unfortunately, I got no answer and voicemail is full at this time. I will attempt to reach her again tomorrow morning.  Also, the only allergies she listed with [place] were aspirin and codeine, both of which cause swelling. We will need to go through her allergy list again when we speak with her.

## 2018-04-27 NOTE — Telephone Encounter (Signed)
Pt is requesting a different prep than suprep because she is allergic to sulfates. Her procedure is this coming Thursday 2/27. Pls call her.

## 2018-04-28 NOTE — Telephone Encounter (Signed)
I have spoken to patient. I advised that we only have 2 allergies on file. I updated allergy list with her. In addition, I have advised that we will give her miralax prep to ensure no reaction from sulphites. She states that she will come by the office and will pick up prep instructions for miralax prep today at 11:30.

## 2018-04-29 ENCOUNTER — Encounter: Payer: Self-pay | Admitting: Internal Medicine

## 2018-04-29 ENCOUNTER — Ambulatory Visit (AMBULATORY_SURGERY_CENTER): Payer: BC Managed Care – PPO | Admitting: Internal Medicine

## 2018-04-29 VITALS — BP 155/74 | HR 62 | Temp 97.7°F | Resp 17 | Ht 64.0 in | Wt 161.0 lb

## 2018-04-29 DIAGNOSIS — K633 Ulcer of intestine: Secondary | ICD-10-CM

## 2018-04-29 DIAGNOSIS — D123 Benign neoplasm of transverse colon: Secondary | ICD-10-CM | POA: Diagnosis not present

## 2018-04-29 DIAGNOSIS — K639 Disease of intestine, unspecified: Secondary | ICD-10-CM | POA: Diagnosis not present

## 2018-04-29 DIAGNOSIS — Z1211 Encounter for screening for malignant neoplasm of colon: Secondary | ICD-10-CM | POA: Diagnosis present

## 2018-04-29 MED ORDER — SODIUM CHLORIDE 0.9 % IV SOLN: 500.0000 mL | Freq: Once | INTRAVENOUS | Status: DC

## 2018-04-29 NOTE — Progress Notes (Signed)
Called to room to assist during endoscopic procedure.  Patient ID and intended procedure confirmed with present staff. Received instructions for my participation in the procedure from the performing physician.  

## 2018-04-29 NOTE — Patient Instructions (Signed)
YOU HAD AN ENDOSCOPIC PROCEDURE TODAY AT THE [facility]:   Refer to the procedure report that was given to you for any specific questions about what was found during the examination.  If the procedure report does not answer your questions, please call your gastroenterologist to clarify.  If you requested that your care partner not be given the details of your procedure findings, then the procedure report has been included in a sealed envelope for you to review at your convenience later.  YOU SHOULD EXPECT: Some feelings of bloating in the abdomen. Passage of more gas than usual.  Walking can help get rid of the air that was put into your GI tract during the procedure and reduce the bloating. If you had a lower endoscopy (such as a colonoscopy or flexible sigmoidoscopy) you may notice spotting of blood in your stool or on the toilet paper. If you underwent a bowel prep for your procedure, you may not have a normal bowel movement for a few days.  Please Note:  You might notice some irritation and congestion in your nose or some drainage.  This is from the oxygen used during your procedure.  There is no need for concern and it should clear up in a day or so.  SYMPTOMS TO REPORT IMMEDIATELY:   Following lower endoscopy (colonoscopy or flexible sigmoidoscopy):  Excessive amounts of blood in the stool  Significant tenderness or worsening of abdominal pains  Swelling of the abdomen that is new, acute  Fever of 100F or higher  For urgent or emergent issues, a gastroenterologist can be reached at any hour by calling [phone].   DIET:  We do recommend a small meal at first, but then you may proceed to your regular diet.  Drink plenty of fluids but you should avoid alcoholic beverages for 24 hours.  ACTIVITY:  You should plan to take it easy for the rest of today and you should NOT DRIVE or use heavy machinery until tomorrow (because of the sedation medicines used during the test).     FOLLOW UP: Our staff will call the number listed on your records the next business day following your procedure to check on you and address any questions or concerns that you may have regarding the information given to you following your procedure. If we do not reach you, we will leave a message.  However, if you are feeling well and you are not experiencing any problems, there is no need to return our call.  We will assume that you have returned to your regular daily activities without incident.  If any biopsies were taken you will be contacted by phone or by letter within the next 1-3 weeks.  Please call us at [phone] if you have not heard about the biopsies in 3 weeks.   Await for biopsy results Polyps (handout given)    SIGNATURES/CONFIDENTIALITY: You and/or your care partner have signed paperwork which will be entered into your electronic medical record.  These signatures attest to the fact that that the information above on your After Visit Summary has been reviewed and is understood.  Full responsibility of the confidentiality of this discharge information lies with you and/or your care-partner. 

## 2018-04-29 NOTE — Progress Notes (Signed)
PT taken to PACU. Monitors in place. VSS. Report given to RN. 

## 2018-04-29 NOTE — Op Note (Signed)
[facility] Patient Name: Lindsey Krause Procedure Date: 04/29/2018 8:35 AM MRN: [id number] Endoscopist: Jerene Bears , MD Age: 68 Referring MD:  Date of Birth: Aug 13, 1950 Gender: Female Account #: [id number] Procedure:                Colonoscopy Indications:              Screening for colorectal malignant neoplasm, Last                            colonoscopy: 2006 (nonspecific cecal inflammation),                            negative Cologuard 3 years ago, incidental                            left-sided intermittent abdominal pain Medicines:                Monitored Anesthesia Care Procedure:                Pre-Anesthesia Assessment:                           - Prior to the procedure, a History and Physical                            was performed, and patient medications and                            allergies were reviewed. The patient's tolerance of                            previous anesthesia was also reviewed. The risks                            and benefits of the procedure and the sedation                            options and risks were discussed with the patient.                            All questions were answered, and informed consent                            was obtained. Prior Anticoagulants: The patient has                            taken no previous anticoagulant or antiplatelet                            agents. ASA Grade Assessment: II - A patient with                            mild systemic disease. After reviewing the risks  and benefits, the patient was deemed in                            satisfactory condition to undergo the procedure.                           After obtaining informed consent, the colonoscope                            was passed under direct vision. Throughout the                            procedure, the patient's blood pressure, pulse, and                            oxygen saturations were  monitored continuously. The                            Model PCF-[id number]) scope was introduced                            through the anus and advanced to the terminal                            ileum. The colonoscopy was performed without                            difficulty. The patient tolerated the procedure                            well. The quality of the bowel preparation was                            good. The terminal ileum, ileocecal valve,                            appendiceal orifice, and rectum were photographed. Scope In: 8:54:13 AM Scope Out: 9:14:17 AM Scope Withdrawal Time: 0 hours 16 minutes 57 seconds  Total Procedure Duration: 0 hours 20 minutes 4 seconds  Findings:                 The terminal ileum contained a single (solitary)                            six mm ulcer. No bleeding was present. Biopsies                            were taken with a cold forceps for histology. The                            remaining examined ileal mucosa normal.                           A scattered area of mildly erythematous mucosa with  erosions was found in the cecum and at the                            ileocecal valve. This was biopsied with a cold                            forceps for histology.                           A 4 mm polyp was found in the transverse colon. The                            polyp was sessile. The polyp was removed with a                            cold snare. Resection and retrieval were complete.                           Normal mucosa was found in the rectum, in the                            sigmoid colon, in the descending colon, in the                            transverse colon, at the hepatic flexure, in the                            mid ascending colon and in the distal ascending                            colon.                           The retroflexed view of the distal rectum and anal                             verge was normal and showed no anal or rectal                            abnormalities. Complications:            No immediate complications. Estimated Blood Loss:     Estimated blood loss was minimal. Impression:               - A single (solitary) ulcer in the terminal ileum.                            Biopsied.                           - Erythematous mucosa in the cecum and at the                            ileocecal valve. Biopsied.                           -  One 4 mm polyp in the transverse colon, removed                            with a cold snare. Resected and retrieved.                           - Normal mucosa in the rectum, in the sigmoid                            colon, in the descending colon, in the transverse                            colon, at the hepatic flexure, in the mid ascending                            colon and in the distal ascending colon.                           - The distal rectum and anal verge are normal on                            retroflexion view. Recommendation:           - Patient has a contact number available for                            emergencies. The signs and symptoms of potential                            delayed complications were discussed with the                            patient. Return to normal activities tomorrow.                            Written discharge instructions were provided to the                            patient.                           - Resume previous diet.                           - Continue present medications.                           - Await pathology results.                           - Repeat colonoscopy is recommended. The                            colonoscopy date will be determined after pathology  results from today's exam become available for                            review. Jerene Bears, MD 04/29/2018 9:25:39 AM This report has been signed  electronically.

## 2018-04-30 ENCOUNTER — Telehealth: Payer: Self-pay | Admitting: *Deleted

## 2018-04-30 NOTE — Telephone Encounter (Signed)
  Follow up Call-  Call back number 04/29/2018  Post procedure Call Back phone  # [phone]  Permission to leave phone message Yes  Some recent data might be hidden     Patient questions:  Do you have a fever, pain , or abdominal swelling? No. Pain Score  0 *  Have you tolerated food without any problems? Yes.    Have you been able to return to your normal activities? Yes.    Do you have any questions about your discharge instructions: Diet   No. Medications  No. Follow up visit  No.  Do you have questions or concerns about your Care? No.  Actions: * If pain score is 4 or above: No action needed, pain <4.

## 2018-05-06 ENCOUNTER — Encounter: Payer: Self-pay | Admitting: Internal Medicine

## 2019-04-14 ENCOUNTER — Ambulatory Visit: Payer: BC Managed Care – PPO | Attending: Family

## 2019-04-14 DIAGNOSIS — Z23 Encounter for immunization: Secondary | ICD-10-CM

## 2019-04-14 NOTE — Progress Notes (Signed)
   Covid-19 Vaccination Clinic  Name:  Lindsey Krause    MRN: [id number] DOB: 1950/03/28  04/14/2019  [name] was observed post Covid-19 immunization for 15 minutes without incidence. She was provided with Vaccine Information Sheet and instruction to access the V-Safe system.   [name] was instructed to call 911 with any severe reactions post vaccine: Marland Kitchen Difficulty breathing  . Swelling of your face and throat  . A fast heartbeat  . A bad rash all over your body  . Dizziness and weakness    Immunizations Administered    Name Date Dose VIS Date Route   Moderna COVID-19 Vaccine 04/14/2019 11:34 AM 0.5 mL 02/01/2019 Intramuscular   Manufacturer: Moderna   Lot: [id number]   HoffmanVO:[id number]

## 2019-05-17 ENCOUNTER — Ambulatory Visit: Payer: BC Managed Care – PPO | Attending: Internal Medicine

## 2019-05-17 DIAGNOSIS — Z23 Encounter for immunization: Secondary | ICD-10-CM

## 2019-05-17 NOTE — Progress Notes (Signed)
   Covid-19 Vaccination Clinic  Name:  Lindsey Krause    MRN: [id number] DOB: 12-22-1950  05/17/2019  Ms. Getting was observed post Covid-19 immunization for 15 minutes without incident. She was provided with Vaccine Information Sheet and instruction to access the V-Safe system.   [name] was instructed to call 911 with any severe reactions post vaccine: Marland Kitchen Difficulty breathing  . Swelling of face and throat  . A fast heartbeat  . A bad rash all over body  . Dizziness and weakness   Immunizations Administered    Name Date Dose VIS Date Route   Moderna COVID-19 Vaccine 05/17/2019 10:38 AM 0.5 mL 02/01/2019 Intramuscular   Manufacturer: Moderna   Lot: [id number]   SteinauerVO:[id number]

## 2019-10-28 IMAGING — US US THYROID
1 series · 13 of 25 positions shown · non-contrast
Comparison: None.

CLINICAL DATA: Thyromegaly on physical exam

EXAM:
THYROID ULTRASOUND
TECHNIQUE: Ultrasound examination of the thyroid gland and adjacent soft
tissues was performed.

[Series 1: us thyroid · 0.06mm/px · 13 of 52 slices shown]
[im 1/52]
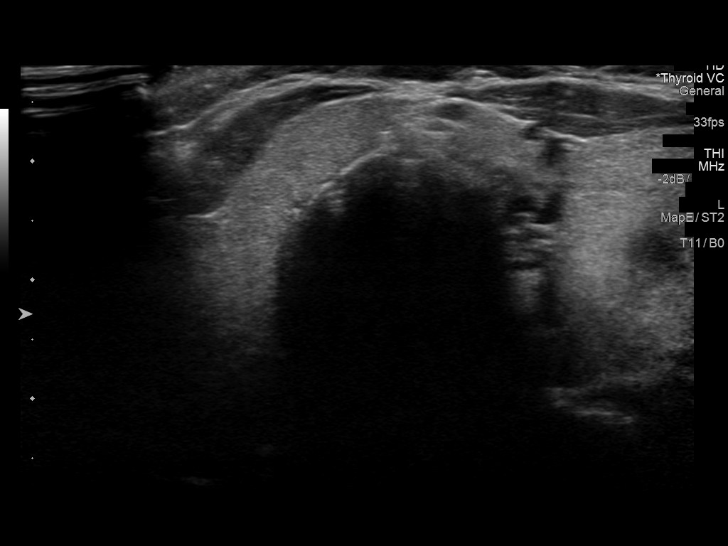
[im 5/52]
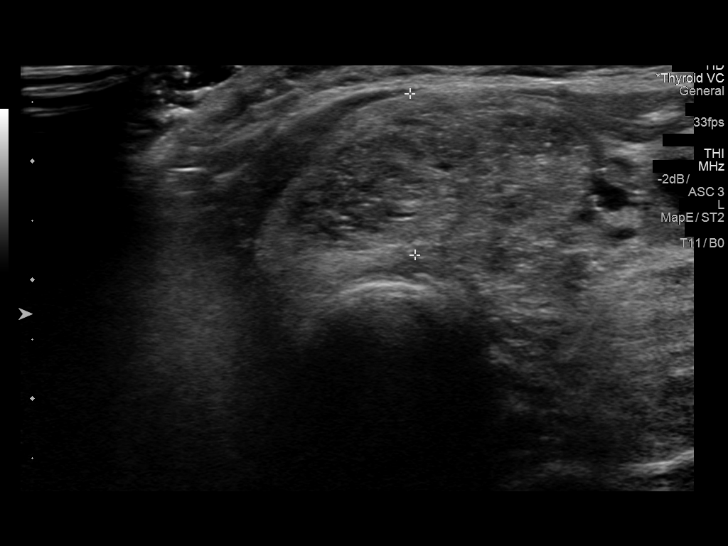
[im 9/52]
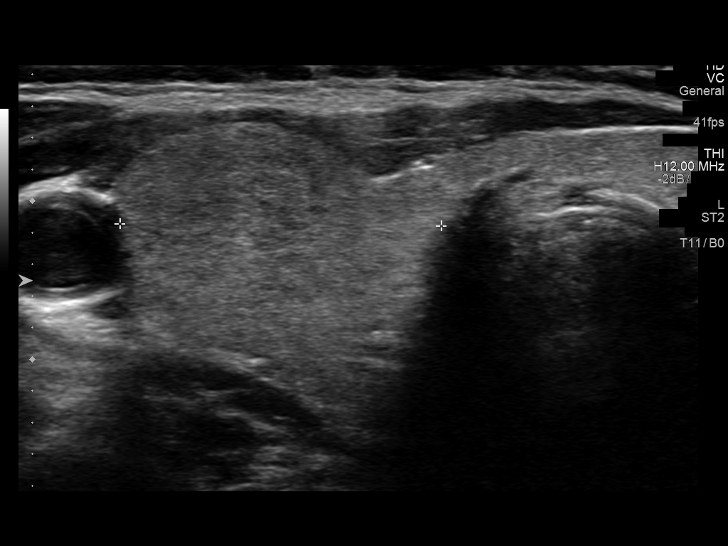
[im 13/52]
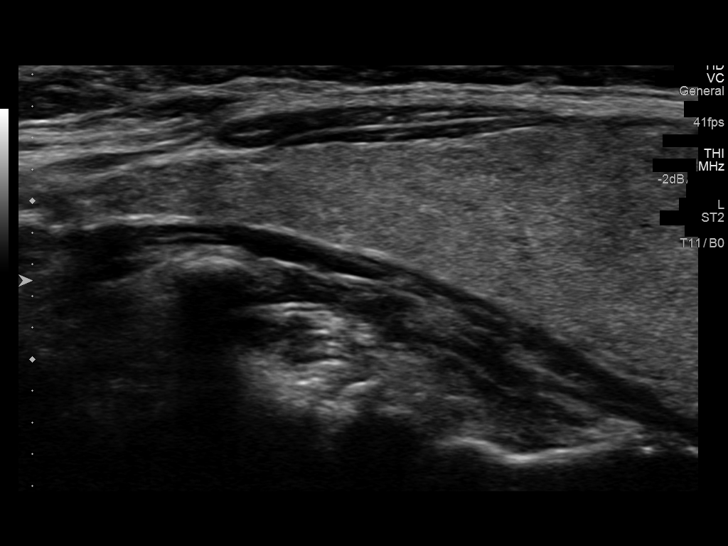
[im 18/52]
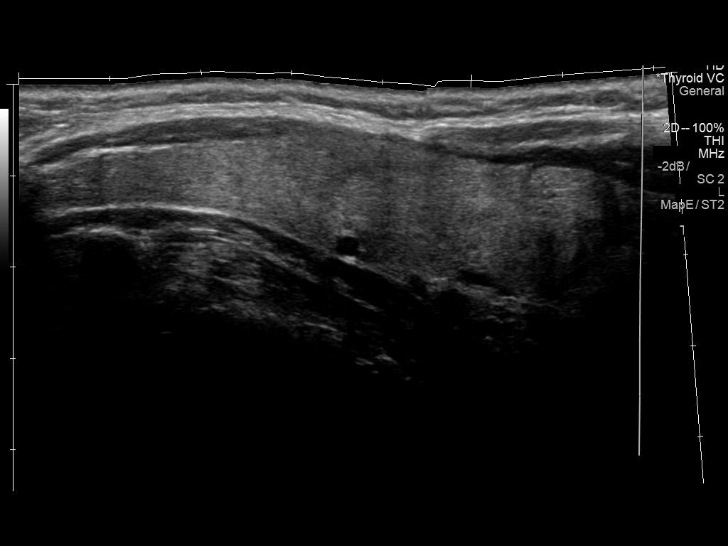
[im 22/52]
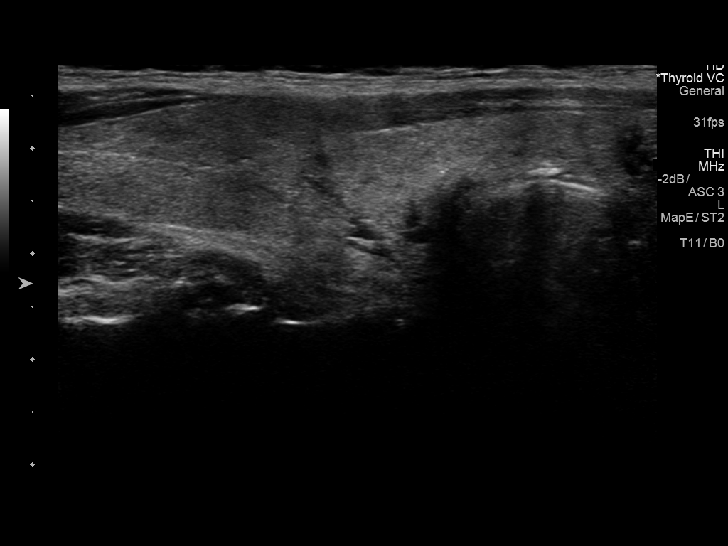
[im 26/52]
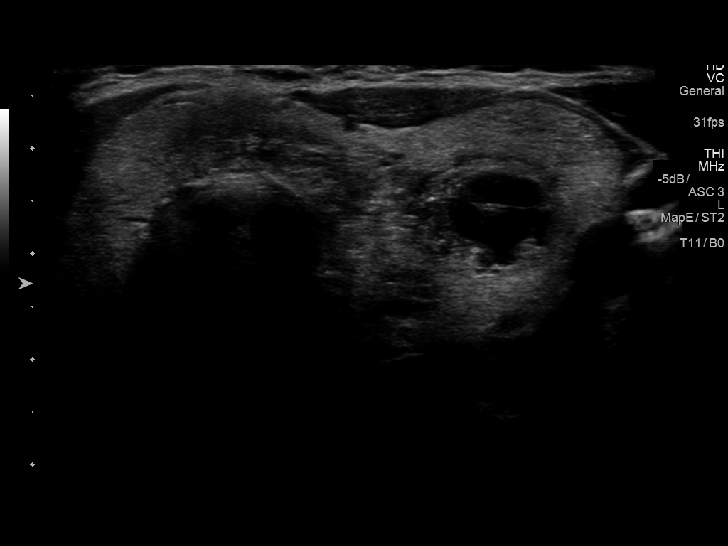
[im 30/52]
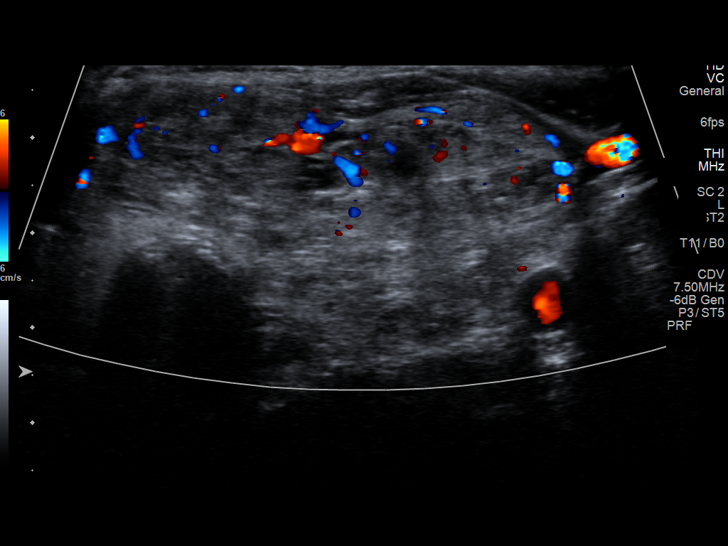
[im 35/52]
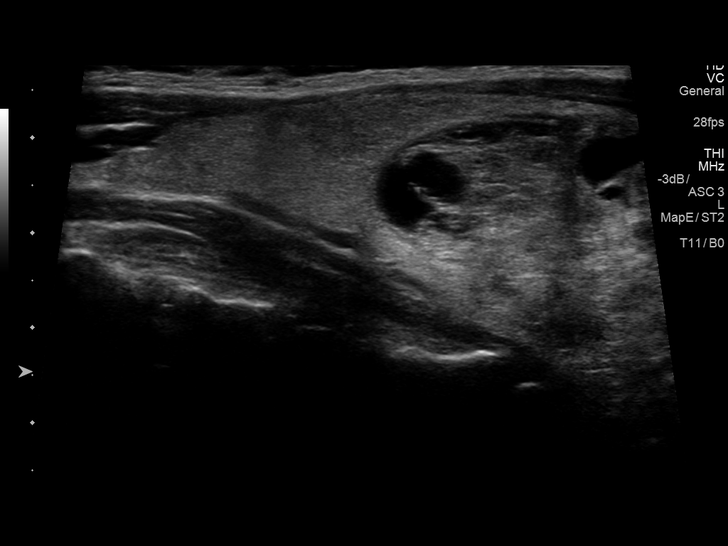
[im 39/52]
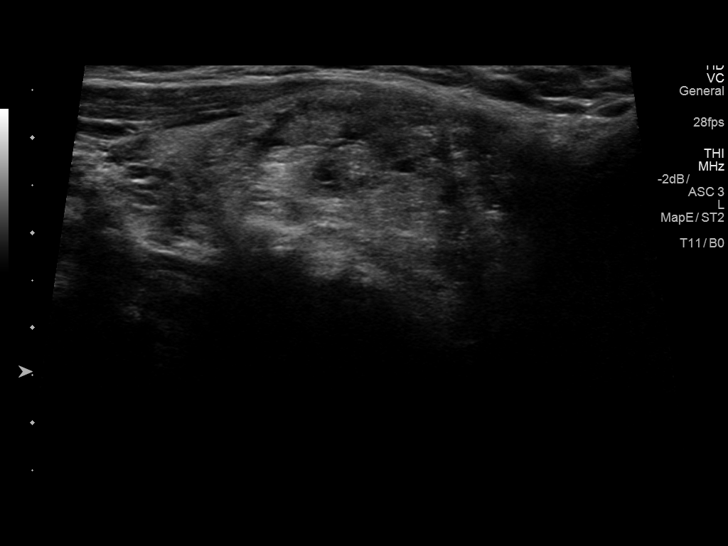
[im 43/52]
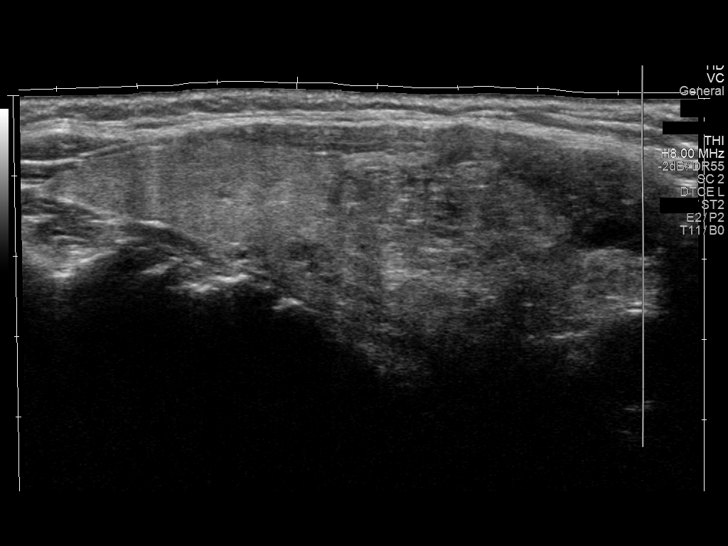
[im 47/52]
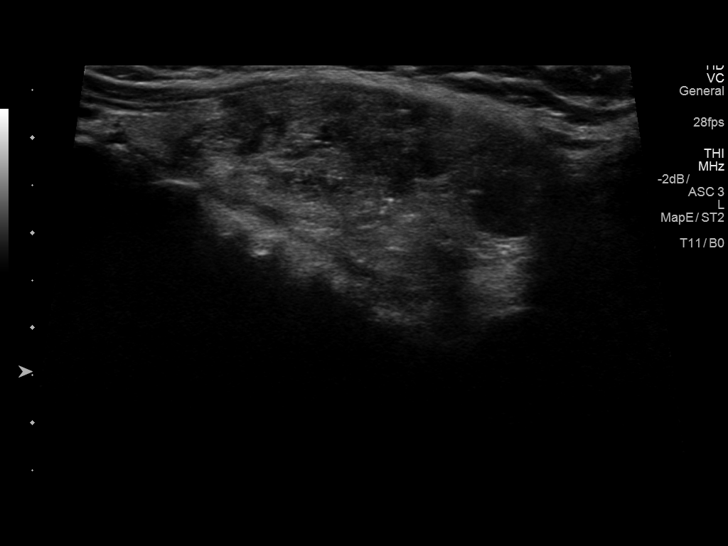
[im 52/52]
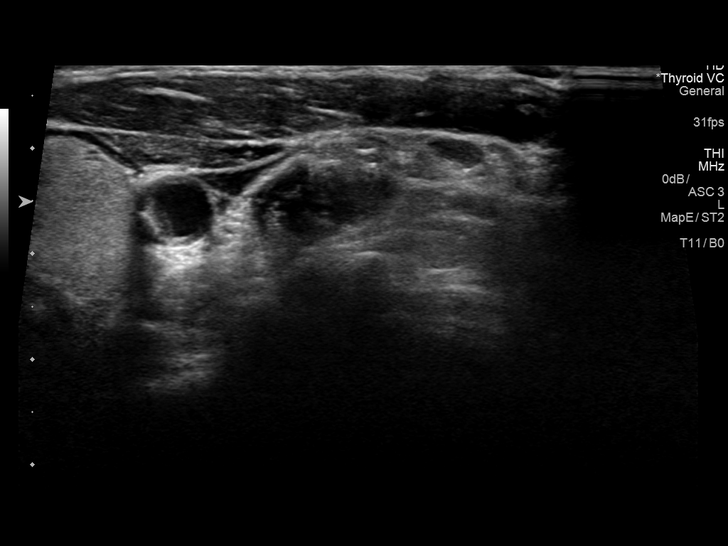

[13 of 25 positions shown; findings below may reference images not displayed]

FINDINGS: Parenchymal Echotexture: Mildly heterogenous

Isthmus: 1.4 cm thickness

Right lobe: 6.5 x 1.4 x 2 cm

Left lobe: 8 x 3 x 3.5 cm

_________________________________________________________

Estimated total number of nodules >/= 1 cm: 1

Number of spongiform nodules >/=  2 cm not described below (TR1): 0

Number of mixed cystic and solid nodules >/= 1.5 cm not described
below (TR2): 0

_________________________________________________________

Nodule # 1:

Location: Left; Mid

Maximum size: 5.3 cm; Other 2 dimensions: 2.6 x 5.1 cm

Composition: solid/almost completely solid (2)

Echogenicity: hypoechoic (2)

Shape: not taller-than-wide (0)

Margins: ill-defined (0)

Echogenic foci: none (0)

ACR TI-RADS total points: 4.

ACR TI-RADS risk category: TR4 (4-6 points).

ACR TI-RADS recommendations:

**Given size (>/= 1.5 cm) and appearance, fine needle aspiration of
this moderately suspicious nodule should be considered based on
TI-RADS criteria.

_________________________________________________________
IMPRESSION: 1. Thyromegaly.
2. Solitary left 5.3 cm moderately suspicious nodule. Recommend FNA
biopsy.

The above is in keeping with the ACR TI-RADS recommendations - [HOSPITAL] 6807;[DATE].

## 2020-01-19 ENCOUNTER — Ambulatory Visit: Payer: BC Managed Care – PPO | Attending: Family

## 2020-01-19 DIAGNOSIS — Z23 Encounter for immunization: Secondary | ICD-10-CM

## 2020-03-29 DIAGNOSIS — K219 Gastro-esophageal reflux disease without esophagitis: Secondary | ICD-10-CM | POA: Insufficient documentation

## 2020-03-29 DIAGNOSIS — K2100 Gastro-esophageal reflux disease with esophagitis, without bleeding: Secondary | ICD-10-CM | POA: Insufficient documentation

## 2020-05-02 NOTE — Progress Notes (Signed)
   Covid-19 Vaccination Clinic  Name:  Lindsey Krause    MRN: [id number] DOB: [birth year]  05/02/2020  [name] was observed post Covid-19 immunization for 15 minutes without incident. She was provided with Vaccine Information Sheet and instruction to access the V-Safe system.   [name] was instructed to call 911 with any severe reactions post vaccine: Marland Kitchen Difficulty breathing  . Swelling of face and throat  . A fast heartbeat  . A bad rash all over body  . Dizziness and weakness   Immunizations Administered    Name Date Dose VIS Date Route   Moderna Covid-19 Booster Vaccine 01/19/2020 12:30 PM 0.25 mL 12/21/2019 Intramuscular   Manufacturer: Moderna   Lot: [id number]   [place]: 76160-737-10

## 2020-05-04 DIAGNOSIS — E7800 Pure hypercholesterolemia, unspecified: Secondary | ICD-10-CM | POA: Insufficient documentation

## 2020-09-14 ENCOUNTER — Ambulatory Visit
Admission: RE | Admit: 2020-09-14 | Discharge: 2020-09-14 | Disposition: A | Discharge status: Home / Self Care | Payer: Medicare Other | Source: Ambulatory Visit | Attending: Family Medicine | Admitting: Family Medicine

## 2020-09-14 ENCOUNTER — Other Ambulatory Visit: Payer: Self-pay | Admitting: Family Medicine

## 2020-09-14 DIAGNOSIS — M545 Low back pain, unspecified: Secondary | ICD-10-CM

## 2021-02-01 ENCOUNTER — Encounter: Payer: Self-pay | Admitting: Orthopedic Surgery

## 2021-02-01 ENCOUNTER — Other Ambulatory Visit: Payer: Self-pay

## 2021-02-01 ENCOUNTER — Ambulatory Visit (INDEPENDENT_AMBULATORY_CARE_PROVIDER_SITE_OTHER): Payer: Medicare Other | Admitting: Orthopedic Surgery

## 2021-02-01 ENCOUNTER — Ambulatory Visit: Payer: Self-pay

## 2021-02-01 VITALS — BP 138/70 | HR 66 | Ht 64.0 in | Wt 156.8 lb

## 2021-02-01 DIAGNOSIS — M65312 Trigger thumb, left thumb: Secondary | ICD-10-CM

## 2021-02-01 DIAGNOSIS — G8929 Other chronic pain: Secondary | ICD-10-CM | POA: Diagnosis not present

## 2021-02-01 DIAGNOSIS — M79645 Pain in left finger(s): Secondary | ICD-10-CM | POA: Diagnosis not present

## 2021-02-01 NOTE — Progress Notes (Signed)
Office Visit Note   Patient: Lindsey Krause           Date of Birth: May 14, 1950           MRN: [id number] Visit Date: 02/01/2021              Requested by: Bartholome Bill, [address],  [place] [address] PCP: Bartholome Bill, MD   Assessment & Plan: Visit Diagnoses:  1. Chronic pain of left thumb   2. Trigger thumb, left thumb     Plan: We discussed the diagnosis, prognosis, non-operative and operative treatment options for trigger thumb including corticosteroid injection and A1 pulley release.  After our discussion, the patient would like to continue to monitor her symptoms and will try some natural remedies.  I was unable to offer her any personal experience with such treatment.  She is not interested in corticosteroid injection or surgery at this point.  We reviewed the risks and benefits of conservative management.  The patient expressed understanding of the reasoning and strategy going forward.  All patient questions and concerns were addressed.    Follow-Up Instructions: No follow-ups on file.   Orders:  Orders Placed This Encounter  Procedures   XR Finger Thumb Left   No orders of the defined types were placed in this encounter.     Procedures: No procedures performed   Clinical Data: No additional findings.   Subjective: Chief Complaint  Patient presents with   Left Thumb - New Patient (Initial Visit)    This is a 70 yo RHD F who presents with painful locking and catching of her left thumb IP joint.  This has been going on for several months. She has not tried any treatment yet.  She denies triggering of any other digits.  She has no real pain at the base of her thumb.     Review of Systems   Objective: Vital Signs: BP 138/70 (BP Location: Left Arm, Patient Position: Sitting, Cuff Size: Normal)   Pulse 66   Ht 5\' 4"  (1.626 m)   Wt 156 lb 12.8 oz (71.1 kg)   SpO2 98%   BMI 26.91 kg/m   Physical  Exam Constitutional:      Appearance: Normal appearance.  Cardiovascular:     Rate and Rhythm: Normal rate.     Pulses: Normal pulses.  Pulmonary:     Effort: Pulmonary effort is normal.  Skin:    General: Skin is warm and dry.     Capillary Refill: Capillary refill takes less than 2 seconds.  Neurological:     Mental Status: She is alert.    Left Hand Exam   Tenderness  Left hand tenderness location: TTP at thumb A1 pulley with palpable nodule.   Other  Erythema: absent Sensation: normal Pulse: present  Comments:  Palpable and visible triggering of thumb IP joint. No pain w/ [facility] grind test.  No triggering of other digits.      Specialty Comments:  No specialty comments available.  Imaging: No results found.   PMFS History: Patient Active Problem List   Diagnosis Date Noted   Trigger thumb, left thumb 02/01/2021   Past Medical History:  Diagnosis Date   Anxiety    Colitis 12/02/2004   Dr Collene Mares "focal active colitis"   Hyperlipidemia    Hypertension     Family History  Problem Relation Age of Onset   Heart disease Father    Diabetes Sister  Diabetes Maternal Grandmother    Colon cancer Cousin    Breast cancer Other     Past Surgical History:  Procedure Laterality Date   COLONOSCOPY     Social History   Occupational History   Not on file  Tobacco Use   Smoking status: Former   Smokeless tobacco: Never   Tobacco comments:    in early 20's  Vaping Use   Vaping Use: Never used  Substance and Sexual Activity   Alcohol use: No   Drug use: No   Sexual activity: Not on file

## 2021-05-21 ENCOUNTER — Ambulatory Visit: Payer: Medicare PPO | Admitting: Podiatry

## 2021-05-21 ENCOUNTER — Other Ambulatory Visit: Payer: Self-pay

## 2021-05-21 ENCOUNTER — Encounter: Payer: Self-pay | Admitting: Podiatry

## 2021-05-21 DIAGNOSIS — B351 Tinea unguium: Secondary | ICD-10-CM

## 2021-05-21 DIAGNOSIS — N950 Postmenopausal bleeding: Secondary | ICD-10-CM | POA: Insufficient documentation

## 2021-05-21 DIAGNOSIS — N760 Acute vaginitis: Secondary | ICD-10-CM | POA: Insufficient documentation

## 2021-05-21 MED ORDER — TERBINAFINE HCL 250 MG PO TABS: 250.0000 mg | ORAL_TABLET | Freq: Every day | ORAL | 0 refills | Status: DC

## 2021-05-24 NOTE — Progress Notes (Signed)
Of the  ?Subjective:  ?Patient ID: Lindsey Krause, female    DOB: [birth year],  MRN: [id number] ? ?Chief Complaint  ?Patient presents with  ? Nail Problem  ?    NP Ingrown toenail and nail fungi; overall feet health  ? ? ?71 y.o. female presents with the above complaint. History confirmed with patient.  She has had thick discolored toenails for about 1 year, feels like the edges of the toenails become ingrown she has other issues she would like to discuss in the future as well ? ?Objective:  ?Physical Exam: ?warm, good capillary refill, no trophic changes or ulcerative lesions, normal DP and PT pulses, normal sensory exam, and she has onychomycosis with discoloration of multiple toenails as noted in photographs below. ? ? ? ? ?Assessment:  ? ?1. Onychomycosis   ? ? ? ?Plan:  ?Patient was evaluated and treated and all questions answered. ? ?Today we discussed etiology and treatment options in detail of onychomycosis.  We discussed antifungal therapy including topical and oral therapy.  I recommended oral therapy with Lamisil.  We discussed the use this and its possible side effects.  We discussed laser treatment as well if this is not successful which she may consider.  I will see her back in 4 months for follow-up ? ?Return in about 4 months (around 09/20/2021) for follow up after nail fungus treatment.  ? ?

## 2021-06-03 ENCOUNTER — Telehealth: Payer: Self-pay

## 2021-06-03 NOTE — Telephone Encounter (Signed)
NOTES SCANNED TO REFERRAL 

## 2021-07-15 ENCOUNTER — Telehealth: Payer: Self-pay | Admitting: Cardiovascular Disease

## 2021-07-15 ENCOUNTER — Ambulatory Visit (HOSPITAL_BASED_OUTPATIENT_CLINIC_OR_DEPARTMENT_OTHER): Payer: Medicare PPO | Admitting: Cardiovascular Disease

## 2021-07-15 ENCOUNTER — Encounter (HOSPITAL_BASED_OUTPATIENT_CLINIC_OR_DEPARTMENT_OTHER): Payer: Self-pay | Admitting: Cardiovascular Disease

## 2021-07-15 ENCOUNTER — Ambulatory Visit (INDEPENDENT_AMBULATORY_CARE_PROVIDER_SITE_OTHER): Payer: Medicare PPO

## 2021-07-15 VITALS — BP 142/72 | HR 52 | Ht 64.0 in | Wt 157.0 lb

## 2021-07-15 DIAGNOSIS — R001 Bradycardia, unspecified: Secondary | ICD-10-CM

## 2021-07-15 DIAGNOSIS — R002 Palpitations: Secondary | ICD-10-CM

## 2021-07-15 DIAGNOSIS — E7800 Pure hypercholesterolemia, unspecified: Secondary | ICD-10-CM | POA: Diagnosis not present

## 2021-07-15 DIAGNOSIS — I10 Essential (primary) hypertension: Secondary | ICD-10-CM | POA: Diagnosis not present

## 2021-07-15 NOTE — Assessment & Plan Note (Signed)
Blood pressures are labile.  She reports a significant history of whitecoat hypertension. ?

## 2021-07-15 NOTE — Progress Notes (Unsigned)
? ? ?Cardiology Office Note ? ? ?Date:  07/15/2021  ? ?ID:  Lindsey Krause, DOB October 01, 1950, MRN [id number] ? ?PCP:  Bartholome Bill, MD  ?Cardiologist:   Skeet Latch, MD  ? ?No chief complaint on file. ? ?  ?History of Present Illness: ?Lindsey Krause is a 71 y.o. female with hypertension, GERD, hyperlipidemia, anxiety who is being seen today for the evaluation of palpitations at the request of Bartholome Bill, MD. she saw her PCP 05/2021 and reported palpitations.  She started noticing palpitations 2-3 months ago.  She first noticed it when lifting weights. She eerc ? ?She has been prescribed rosuvastatin but does not take it regularly due to side effects. ? ? ?2-3 months ago.  Less lately ?More hwen weightl ?Seconds ?Only 2 last few wee ?There is no associated shortness of breath, CP or ?Mild lightheadedness. ?She checks her BP at home frequently.  At times it is low.  It usually runs in the 110s/50s.   ? ?BP averages 120-130 after sitting and resting during the day.  At night it can be as low as 110/50s.   ? ?Limited caffeine 2/2 GERD ?Exercises 5-6/week ?Diet pretty good ? ?She has no LE edema, orthopnea or PND. ? ?Cardio, dance, step ?weights ? ? ?Past Medical History:  ?Diagnosis Date  ? Anxiety   ? Colitis 12/02/2004  ? Dr Collene Mares "focal active colitis"  ? Hyperlipidemia   ? Hypertension   ? ? ?Past Surgical History:  ?Procedure Laterality Date  ? COLONOSCOPY    ? ? ? ?Current Outpatient Medications  ?Medication Sig Dispense Refill  ? amlodipine-benazepril (LOTREL) 2.5-10 MG capsule Take 1 capsule by mouth daily.    ? cholecalciferol (VITAMIN D) 1000 units tablet Take 1,000 Units by mouth daily.    ? Cholecalciferol 25 MCG (1000 UT) tablet Take 1 tablet by mouth daily.    ? Coenzyme Q10 (CO Q 10 PO) Take by mouth.    ? hyoscyamine (LEVSIN SL) 0.125 MG SL tablet Place 1 tablet (0.125 mg total) under the tongue every 6 (six) hours as needed. (Patient not taking: Reported on 04/29/2018) 30 tablet 1  ?  LORazepam (ATIVAN) 0.5 MG tablet TAKE ONE TABLET BY MOUTH TWICE A DAY AS NEEDED FOR ANXIETY    ? meclizine (ANTIVERT) 12.5 MG tablet Take 1 or 2 tab every 8 hours as needed for vertigo    ? Probiotic Product (PROBIOTIC ADVANCED PO) Take by mouth.    ? rosuvastatin (CRESTOR) 5 MG tablet rosuvastatin 5 mg tablet    ? terbinafine (LAMISIL) 250 MG tablet Take 1 tablet (250 mg total) by mouth daily. 90 tablet 0  ? ?Current Facility-Administered Medications  ?Medication Dose Route Frequency Provider Last Rate Last Admin  ? 0.9 %  sodium chloride infusion  500 mL Intravenous Once Pyrtle, Lajuan Lines, MD      ? ? ?Allergies:   Benzoic acid, Aspirin, Codeine, Fish allergy, Grass extracts [gramineae pollens], Pollen extract, and Sulfites  ? ? ?Social History:  The patient  reports that she has quit smoking. She has never used smokeless tobacco. She reports that she does not drink alcohol and does not use drugs.  ? ?Family History:  The patient's family history includes Breast cancer in an other family member; Colon cancer in her cousin; Diabetes in her maternal grandmother and sister; Heart disease in her father.  ? ? ?ROS:  Please see the history of present illness.   Otherwise, review of systems are positive for  none.   All other systems are reviewed and negative.  ? ? ?PHYSICAL EXAM: ?VS:  There were no vitals taken for this visit. , BMI There is no height or weight on file to calculate BMI. ?GENERAL:  Well appearing ?HEENT:  Pupils equal round and reactive, fundi not visualized, oral mucosa unremarkable ?NECK:  No jugular venous distention, waveform within normal limits, carotid upstroke brisk and symmetric, no bruits, no thyromegaly ?LYMPHATICS:  No cervical adenopath ?LUNGS:  Clear to auscultation bilaterally ?HEART:  RRR.  PMI not displaced or sustained,S1 and S2 within normal limits, no S3, no S4, no clicks, no rubs, no murmurs ?ABD:  Flat, positive bowel sounds normal in frequency in pitch, no bruits, no rebound, no  guarding, no midline pulsatile mass, no hepatomegaly, no splenomegaly ?EXT:  2 plus pulses throughout, no edema, no cyanosis no clubbing ?SKIN:  No rashes no nodules ?NEURO:  Cranial nerves II through XII grossly intact, motor grossly intact throughout ?PSYCH:  Cognitively intact, oriented to person place and time ? ? ? ?EKG:  EKG is ordered today. ?The ekg ordered today demonstrates sinus bradycardia.  Rate 52 bpm.  Incomplete RBBB. ? ? ?Recent Labs: ?No results found for requested labs within last 8760 hours.  ? ? ?Lipid Panel ?No results found for: CHOL, TRIG, HDL, CHOLHDL, VLDL, LDLCALC, LDLDIRECT ?  ? ?Wt Readings from Last 3 Encounters:  ?02/01/21 156 lb 12.8 oz (71.1 kg)  ?04/29/18 161 lb (73 kg)  ?03/12/18 161 lb 9.6 oz (73.3 kg)  ?  ? ? ?ASSESSMENT AND PLAN: ? ?Essential (primary) hypertension ?Blood pressures are labile.  She reports a significant history of whitecoat hypertension. ? ? ? ?Current medicines are reviewed at length with the patient today.  The patient {ACTIONS; HAS/DOES NOT HAVE:19233} concerns regarding medicines. ? ?The following changes have been made:  {PLAN; NO CHANGE:13088:s} ? ?Labs/ tests ordered today include: *** ?No orders of the defined types were placed in this encounter. ? ? ? ?Disposition:   FU with ***  ? ? ? ?Signed, ?Kamile Fassler C. Oval Linsey, MD, [facility]  ?07/15/2021 9:49 AM    ?[place] ?

## 2021-07-15 NOTE — Telephone Encounter (Signed)
BP follow up as requested  ?

## 2021-07-15 NOTE — Telephone Encounter (Signed)
Discussed with Dr Oval Linsey and will have patient continue current medications ?Advised patient, verbalized understanding  ?

## 2021-07-15 NOTE — Patient Instructions (Addendum)
Medication Instructions:  ?Your physician recommends that you continue on your current medications as directed. Please refer to the Current Medication list given to you today.  ? ?*If you need a refill on your cardiac medications before your next appointment, please call your pharmacy* ? ? ?Lab Work: ?NONE ?If you have labs (blood work) drawn today and your tests are completely normal, you will receive your results only by: ?MyChart Message (if you have MyChart) OR ?A paper copy in the mail ?If you have any lab test that is abnormal or we need to change your treatment, we will call you to review the results. ? ? ?Testing/Procedures: ?CALCIUM SCORE - THIS WILL COST YOU $99 OUT OF POCKET ? ?7 DAY MONITOR  ? ?Follow-Up: ?At [facility], you and your health needs are our priority.  As part of our continuing mission to provide you with exceptional heart care, we have created designated Provider Care Teams.  These Care Teams include your primary Cardiologist (physician) and Advanced Practice Providers (APPs -  Physician Assistants and Nurse Practitioners) who all work together to provide you with the care you need, when you need it. ? ?We recommend signing up for the patient portal called "MyChart".  Sign up information is provided on this After Visit Summary.  MyChart is used to connect with patients for Virtual Visits (Telemedicine).  Patients are able to view lab/test results, encounter notes, upcoming appointments, etc.  Non-urgent messages can be sent to your provider as well.   ?To learn more about what you can do with MyChart, go to [email or web address].   ? ?Your next appointment:   ?6 month(s) ? ?The format for your next appointment:   ?In Person ? ?Provider:   ?Skeet Latch, MD  ? ?[place] NP ? ?Other Instructions ?GO HOME TODAY, CHECK YOUR BLOOD PRESSURE AND CALL THE OFFICE WITH READING [phone] ? ?MONITOR YOUR BLOOD PRESSURE TWICE A DAY. LOG READINGS, BRING  READINGS AND MACHINE TO  FOLLOW UP  ? ?ZIO XT- Long Term Monitor Instructions ? ?Your physician has requested you wear a ZIO patch monitor for 7 days.  ?This is a single patch monitor. [employer] supplies one patch monitor per enrollment. Additional ?stickers are not available. Please do not apply patch if you will be having a Nuclear Stress Test,  ?Echocardiogram, Cardiac CT, MRI, or Chest Xray during the period you would be wearing the  ?monitor. The patch cannot be worn during these tests. You cannot remove and re-apply the  ?ZIO XT patch monitor.  ?Your ZIO patch monitor will be mailed 3 day USPS to your address on file. It may take 3-5 days  ?to receive your monitor after you have been enrolled.  ?Once you have received your monitor, please review the enclosed instructions. Your monitor  ?has already been registered assigning a specific monitor serial # to you. ? ?Billing and Patient Assistance Program Information ? ?We have supplied [employer] with any of your insurance information on file for billing purposes. ?[employer] offers a sliding scale Patient Assistance Program for patients that do not have  ?insurance, or whose insurance does not completely cover the cost of the ZIO monitor.  ?You must apply for the Patient Assistance Program to qualify for this discounted rate.  ?To apply, please call [employer] at [phone], select option 4, select option 2, ask to apply for  ?Patient Assistance Program. Theodore Demark will ask your household income, and how many people  ?are in your household. They will quote  your out-of-pocket cost based on that information.  ?[employer] will also be able to set up a 23-month interest-free payment plan if needed. ? ?Applying the monitor ?  ?Shave hair from upper left chest.  ?Hold abrader disc by orange tab. Rub abrader in 40 strokes over the upper left chest as  ?indicated in your monitor instructions.  ?Clean area with 4 enclosed alcohol pads. Let dry.  ?Apply patch as indicated in monitor instructions. Patch will be  placed under collarbone on left  ?side of chest with arrow pointing upward.  ?Rub patch adhesive wings for 2 minutes. Remove white label marked "1". Remove the white  ?label marked "2". Rub patch adhesive wings for 2 additional minutes.  ?While looking in a mirror, press and release button in center of patch. A small green light will  ?flash 3-4 times. This will be your only indicator that the monitor has been turned on.  ?Do not shower for the first 24 hours. You may shower after the first 24 hours.  ?Press the button if you feel a symptom. You will hear a small click. Record Date, Time and  ?Symptom in the Patient Logbook.  ?When you are ready to remove the patch, follow instructions on the last 2 pages of Patient  ?Logbook. Stick patch monitor onto the last page of Patient Logbook.  ?Place Patient Logbook in the blue and white box. Use locking tab on box and tape box closed  ?securely. The blue and white box has prepaid postage on it. Please place it in the mailbox as  ?soon as possible. Your physician should have your test results approximately 7 days after the  ?monitor has been [facility]  ?Call [facility]at [phone] you have questions regarding  ?your ZIO XT patch monitor. Call them immediately if you see an orange light blinking on your  ?monitor.  ?If your monitor falls off in less than 4 days, contact our Monitor department at [phone]  ?If your monitor becomes loose or falls off after 4 days call [employer] at [phone]  ?suggestions on securing your monitor ? ? ?

## 2021-07-15 NOTE — Telephone Encounter (Signed)
Pt c/o BP issue: STAT if pt c/o blurred vision, one-sided weakness or slurred speech ? ?1. What are your last 5 BP readings?  ?134/75 ?126/69 ? ?2. Are you having any other symptoms (ex. Dizziness, headache, blurred vision, passed out)?  ? ?3. What is your BP issue?  ? ?Patient states during follow up with Dr. Oval Linsey today she was advised to call back to report BP readings. She states she took both readings today, about 5 minutes apart. ?

## 2021-08-16 ENCOUNTER — Encounter (HOSPITAL_BASED_OUTPATIENT_CLINIC_OR_DEPARTMENT_OTHER): Payer: Self-pay | Admitting: Family

## 2021-08-16 ENCOUNTER — Ambulatory Visit (HOSPITAL_BASED_OUTPATIENT_CLINIC_OR_DEPARTMENT_OTHER): Payer: Medicare PPO | Admitting: Family

## 2021-08-16 VITALS — BP 112/64 | HR 53 | Ht 64.0 in | Wt 157.0 lb

## 2021-08-16 DIAGNOSIS — I10 Essential (primary) hypertension: Secondary | ICD-10-CM | POA: Diagnosis not present

## 2021-08-16 DIAGNOSIS — R002 Palpitations: Secondary | ICD-10-CM

## 2021-08-16 DIAGNOSIS — E782 Mixed hyperlipidemia: Secondary | ICD-10-CM

## 2021-08-16 DIAGNOSIS — I471 Supraventricular tachycardia: Secondary | ICD-10-CM | POA: Diagnosis not present

## 2021-08-16 NOTE — Progress Notes (Unsigned)
Office Visit    Patient Name: Macklyn Glandon Date of Encounter: 08/18/2021  PCP:  Bartholome Bill, [place]  Cardiologist:  Skeet Latch, MD  Advanced Practice Provider:  No care team member to display Electrophysiologist:  None      Chief Complaint    Towana Stenglein is a 71 y.o. female presents today for follow up after monitor   Past Medical History    Past Medical History:  Diagnosis Date   Anxiety    Colitis 12/02/2004   Dr Collene Mares "focal active colitis"   Hyperlipidemia    Hypertension    Past Surgical History:  Procedure Laterality Date   COLONOSCOPY      Allergies  Allergies  Allergen Reactions   Benzoic Acid Swelling   Aspirin Swelling   Codeine Swelling   Fish Allergy    Grass Extracts [Gramineae Pollens]    Pollen Extract    Sulfites     History of Present Illness    Cece Milhouse is a 71 y.o. female with a hx of HTN, GERD, HLD, anxiety, PSVT, SVT last seen 07/15/21.  Seen by Dr. Oval Linsey 07/15/21 due to palpitations. No associated chest pain nor shortness of breath. Noted her BP was well controlled at home but often high in doctors offices. She was prescribed rosuvastatin but not taking. Monitor was ordered was well as coronary calcium score.   Presents today for follow up. Monitor with NSR, up to 8 beats SVT and 5 beats of NSVT. Reviewed in clinic. Denies palpitations and hesitant regarding additional meds. Continues to exercise regularly doing cardiovascular exercises. Reports no shortness of breath nor dyspnea on exertion. Reports no chest pain, pressure, or tightness. No edema, orthopnea, PND.   EKGs/Labs/Other Studies Reviewed:   The following studies were reviewed today:  Monitor 07/30/21 5 Day Zio Monitor   Quality: Fair.  Baseline artifact. Predominant rhythm: Sinus rhythm Average heart rate: 64 bpm Max heart rate: 116 bpm Min heart rate: 45 bpm Pauses >2.5 seconds: None   5 beats NSVT Up  to 8 beats SVT Rare PACs and PVCs  EKG:  No EKG today  Recent Labs: No results found for requested labs within last 365 days.  Recent Lipid Panel No results found for: "CHOL", "TRIG", "HDL", "CHOLHDL", "VLDL", "LDLCALC", "LDLDIRECT"   Home Medications   Current Meds  Medication Sig   amlodipine-benazepril (LOTREL) 2.5-10 MG capsule Take 1 capsule by mouth daily.   Cholecalciferol (VITAMIN D) 125 MCG (5000 UT) CAPS Take 5,000 Units by mouth daily.   LORazepam (ATIVAN) 0.5 MG tablet TAKE ONE TABLET BY MOUTH TWICE A DAY AS NEEDED FOR ANXIETY   Probiotic Product (PROBIOTIC ADVANCED PO) Take by mouth.   rosuvastatin (CRESTOR) 5 MG tablet    Current Facility-Administered Medications for the 08/16/21 encounter (Office Visit) with Loel Dubonnet, NP  Medication   0.9 %  sodium chloride infusion     Review of Systems      All other systems reviewed and are otherwise negative except as noted above.  Physical Exam    VS:  BP 112/64   Pulse (!) 53   Ht '5\' 4"'$  (1.626 m)   Wt 157 lb (71.2 kg)   BMI 26.95 kg/m  , BMI Body mass index is 26.95 kg/m.  Wt Readings from Last 3 Encounters:  08/16/21 157 lb (71.2 kg)  07/15/21 157 lb (71.2 kg)  02/01/21 156 lb 12.8 oz (71.1 kg)  GEN: Well nourished, well developed, in no acute distress. HEENT: normal. Neck: Supple, no JVD, carotid bruits, or masses. Cardiac: RRR, no murmurs, rubs, or gallops. No clubbing, cyanosis, edema.  Radials/PT 2+ and equal bilaterally.  Respiratory:  Respirations regular and unlabored, clear to auscultation bilaterally. GI: Soft, nontender, nondistended. MS: No deformity or atrophy. Skin: Warm and dry, no rash. Neuro:  Strength and sensation are intact. Psych: Normal affect.  Assessment & Plan    SVT / VT - Monitor with 5 beats NSVT and up to 8 beats of SVT. Average HR 64 bpm. She notes no recent palpitations. She is hesitant to add Toprol as previously recommended due to baseline bradycardia and  preference to avoid medications. Will update echo to rule out structural abnormality as contributory to episodes of nonsustained VT.  HLD - Not presently taking Rosuvastatin. Coronary calcium score upcoming to determine cholesterol goals.  HTN - BP well controlled. Continue current antihypertensive regimen.       Disposition: Follow up as scheduled with Skeet Latch, MD   Signed, Loel Dubonnet, NP 08/18/2021, 3:53 PM [place]

## 2021-08-16 NOTE — Patient Instructions (Addendum)
Medication Instructions:  Your Physician recommend you continue on your current medication as directed.     *If you need a refill on your cardiac medications before your next appointment, please call your pharmacy*   Lab Work: None ordered today.    Testing/Procedures: Your physician has requested that you have an echocardiogram. Echocardiography is a painless test that uses sound waves to create images of your heart. It provides your doctor with information about the size and shape of your heart and how well your heart's chambers and valves are working. This procedure takes approximately one hour. There are no restrictions for this procedure.    Follow-Up: At [facility], you and your health needs are our priority.  As part of our continuing mission to provide you with exceptional heart care, we have created designated Provider Care Teams.  These Care Teams include your primary Cardiologist (physician) and Advanced Practice Providers (APPs -  Physician Assistants and Nurse Practitioners) who all work together to provide you with the care you need, when you need it.  We recommend signing up for the patient portal called "MyChart".  Sign up information is provided on this After Visit Summary.  MyChart is used to connect with patients for Virtual Visits (Telemedicine).  Patients are able to view lab/test results, encounter notes, upcoming appointments, etc.  Non-urgent messages can be sent to your provider as well.   To learn more about what you can do with MyChart, go to [email or web address].    Your next appointment:   As scheduled    Other Instructions Heart Healthy Diet Recommendations: A low-salt diet is recommended. Meats should be grilled, baked, or boiled. Avoid fried foods. Focus on lean protein sources like fish or chicken with vegetables and fruits. The American Heart Association is a Microbiologist!  American Heart Association Diet and Lifeystyle Recommendations     Exercise recommendations: The American Heart Association recommends 150 minutes of moderate intensity exercise weekly. Try 30 minutes of moderate intensity exercise 4-5 times per week. This could include walking, jogging, or swimming.  Important Information About Sugar      As

## 2021-08-18 ENCOUNTER — Encounter (HOSPITAL_BASED_OUTPATIENT_CLINIC_OR_DEPARTMENT_OTHER): Payer: Self-pay | Admitting: Family

## 2021-08-22 ENCOUNTER — Ambulatory Visit
Admission: RE | Admit: 2021-08-22 | Discharge: 2021-08-22 | Disposition: A | Discharge status: Home / Self Care | Payer: Self-pay | Source: Ambulatory Visit | Attending: Cardiovascular Disease | Admitting: Cardiovascular Disease

## 2021-08-22 DIAGNOSIS — R001 Bradycardia, unspecified: Secondary | ICD-10-CM

## 2021-08-22 DIAGNOSIS — I10 Essential (primary) hypertension: Secondary | ICD-10-CM

## 2021-08-22 DIAGNOSIS — R002 Palpitations: Secondary | ICD-10-CM

## 2021-08-23 ENCOUNTER — Encounter (HOSPITAL_BASED_OUTPATIENT_CLINIC_OR_DEPARTMENT_OTHER): Payer: Self-pay | Admitting: Cardiovascular Disease

## 2021-08-26 ENCOUNTER — Encounter (HOSPITAL_BASED_OUTPATIENT_CLINIC_OR_DEPARTMENT_OTHER): Payer: Self-pay | Admitting: Cardiovascular Disease

## 2021-08-26 DIAGNOSIS — R002 Palpitations: Secondary | ICD-10-CM | POA: Insufficient documentation

## 2021-08-26 HISTORY — DX: Palpitations: R00.2

## 2021-08-26 NOTE — Assessment & Plan Note (Addendum)
She has not been taking her cholesterol medicine due to side effects.  We will get a coronary calcium score to better understand her risk and direct her therapy.

## 2021-08-26 NOTE — Assessment & Plan Note (Signed)
Overall her symptoms seem to have improved.  We will have her wear an ambulatory monitor to ensure there is no evidence of any atrial fibrillation or other significant arrhythmias.  EKG today was unremarkable.

## 2021-08-28 ENCOUNTER — Telehealth (HOSPITAL_BASED_OUTPATIENT_CLINIC_OR_DEPARTMENT_OTHER): Payer: Self-pay | Admitting: *Deleted

## 2021-08-28 DIAGNOSIS — Z5181 Encounter for therapeutic drug level monitoring: Secondary | ICD-10-CM

## 2021-08-28 DIAGNOSIS — E782 Mixed hyperlipidemia: Secondary | ICD-10-CM

## 2021-08-28 DIAGNOSIS — I10 Essential (primary) hypertension: Secondary | ICD-10-CM

## 2021-08-28 NOTE — Telephone Encounter (Signed)
-----   Message from Skeet Latch, MD sent at 08/26/2021  3:59 PM EDT ----- CT scan shows that she does have some plaque in her arteries.  She has about what is expected for her age.  However this does put her at risk of heart attacks and strokes in the long run.  Her LDL cholesterol was 162 when last checked.  It needs to be less than 70.  I know that she did not do well on rosuvastatin.  Recommend trying atorvastatin at a very low dose, just 10 mg daily.  Repeat lipids and a CMP in 2 to 3 months.  If they remain above goal we can think about trying other medicines.  If she has any symptoms with this medicine let me know right away.

## 2021-08-28 NOTE — Telephone Encounter (Signed)
Spoke with patient and she stated it was not that she could not tolerate the Crestor she just never started.  She did see results and started Crestor 5 mg daily a few days ago  Patient would like to know if Dr Oval Linsey agrees with the Crestor 5 mg daily or if she would prefer something different   Will forward to Dr Oval Linsey for review

## 2021-09-02 ENCOUNTER — Inpatient Hospital Stay: Admission: RE | Admit: 2021-09-02 | Payer: Medicare PPO | Source: Ambulatory Visit

## 2021-09-17 NOTE — Telephone Encounter (Signed)
Lab orders placed and mailed

## 2021-09-17 NOTE — Addendum Note (Signed)
Addended by: Alvina Filbert B on: 09/17/2021 09:57 AM   Modules accepted: Orders

## 2021-09-24 ENCOUNTER — Ambulatory Visit: Payer: Medicare PPO | Admitting: Podiatry

## 2021-10-01 ENCOUNTER — Ambulatory Visit (INDEPENDENT_AMBULATORY_CARE_PROVIDER_SITE_OTHER): Payer: Medicare PPO

## 2021-10-01 ENCOUNTER — Ambulatory Visit: Payer: Medicare PPO | Admitting: Orthopaedic Surgery

## 2021-10-01 DIAGNOSIS — M25512 Pain in left shoulder: Secondary | ICD-10-CM

## 2021-10-01 NOTE — Progress Notes (Signed)
Office Visit Note   Patient: Lindsey Krause           Date of Birth: 1950-12-24           MRN: [id number] Visit Date: 10/01/2021              Requested by: Bartholome Bill, [place],  [place] [address] PCP: Bartholome Bill, MD   Assessment & Plan: Visit Diagnoses:  1. Acute pain of left shoulder     Plan: Impression is left shoulder impingement syndrome.  She has quite a large hook of the anterior acromion.  Today, we discussed various treatment options to include trying a course of anti-inflammatories versus subacromial cortisone injection.  She is not interested in either.  She would like to try a course of physical therapy.  I have sent in a referral.  Follow-up as needed.  Follow-Up Instructions: Return if symptoms worsen or fail to improve.   Orders:  Orders Placed This Encounter  Procedures   XR Shoulder Left   Ambulatory referral to Physical Therapy   No orders of the defined types were placed in this encounter.     Procedures: No procedures performed   Clinical Data: No additional findings.   Subjective: Chief Complaint  Patient presents with   Left Shoulder - Pain    HPI patient is a pleasant 71 year old female who comes in today with left shoulder pain for about a week.  She denies any injury but notes that she was traveling last week and then did a workout with her shoulders the following day.  The pain she has is to the anterior aspect.  She denies any pain at rest.  She denies any weakness.  Symptoms are worse with any range of motion of the shoulder.  She has been using heating pad which has helped.  Review of Systems as detailed in HPI.  All others reviewed and are negative.   Objective: Vital Signs: There were no vitals taken for this visit.  Physical Exam well-developed well-nourished female no acute distress.  Alert and oriented x3.  Ortho Exam left shoulder exam reveals forward flexion to  approximately 160 degrees.  Full abduction and external rotation.  She can internally rotate to T12.  Negative empty can, cross body abduction and speeds test.  No tenderness to the [facility] joint.  She has full strength throughout.  Specialty Comments:  No specialty comments available.  Imaging: XR Shoulder Left  Result Date: 10/01/2021 Prominent hook of the anterior acromial process.      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   Annual physical exam 09/12/2014   Vitamin D deficiency 09/12/2014   Essential (primary) hypertension 07/26/2013   Goiter 08/30/2012   Past Medical History:  Diagnosis Date   Anxiety    Colitis 12/02/2004   Dr Collene Mares "focal active colitis"   Hyperlipidemia    Hypertension    Palpitations 08/26/2021    Family History  Problem Relation Age of Onset  Hypertension Mother    Heart attack Father 32   Hypertension Father    Heart disease Father    Bradycardia Father    Hypertension Sister    Diabetes Sister    Cerebral aneurysm Sister    Hypertension Brother    Stroke Brother    Diabetes Maternal Grandmother    Colon cancer Cousin    Breast cancer Other     Past Surgical History:  Procedure Laterality Date   COLONOSCOPY     Social History   Occupational History   Not on file  Tobacco Use   Smoking status: Former   Smokeless tobacco: Never   Tobacco comments:    in early 20's  Vaping Use   Vaping Use: Never used  Substance and Sexual Activity    Alcohol use: No   Drug use: No   Sexual activity: Not on file

## 2021-10-14 ENCOUNTER — Encounter: Payer: Self-pay | Admitting: Physical Therapy

## 2021-10-14 ENCOUNTER — Ambulatory Visit: Payer: Medicare PPO | Attending: Physician Assistant | Admitting: Physical Therapy

## 2021-10-14 DIAGNOSIS — R600 Localized edema: Secondary | ICD-10-CM | POA: Insufficient documentation

## 2021-10-14 DIAGNOSIS — M6281 Muscle weakness (generalized): Secondary | ICD-10-CM | POA: Insufficient documentation

## 2021-10-14 DIAGNOSIS — R293 Abnormal posture: Secondary | ICD-10-CM | POA: Insufficient documentation

## 2021-10-14 DIAGNOSIS — R278 Other lack of coordination: Secondary | ICD-10-CM | POA: Insufficient documentation

## 2021-10-14 DIAGNOSIS — M25512 Pain in left shoulder: Secondary | ICD-10-CM | POA: Insufficient documentation

## 2021-10-14 NOTE — Therapy (Signed)
OUTPATIENT PHYSICAL THERAPY SHOULDER EVALUATION   Patient Name: Lindsey Krause MRN: [id number] DOB:[birth year], 71 y.o., female Today's Date: 10/14/2021    Past Medical History:  Diagnosis Date   Anxiety    Colitis 12/02/2004   Dr Collene Mares "focal active colitis"   Hyperlipidemia    Hypertension    Palpitations 08/26/2021   Past Surgical History:  Procedure Laterality Date   COLONOSCOPY     Patient Active Problem List   Diagnosis Date Noted   Palpitations 08/26/2021   Postmenopausal bleeding 05/21/2021   Vaginitis 05/21/2021   Trigger thumb, left thumb 02/01/2021   Hypercholesterolemia 05/04/2020   Chest pain due to GERD 03/29/2020   Gastroesophageal reflux disease with esophagitis without hemorrhage 03/29/2020   Shortness of breath 09/17/2017   Uterine leiomyoma 03/04/2017   LLQ pain 01/14/2016   Anxiety 06/14/2015   Glaucoma suspect of both eyes 06/12/2015   Transient monocular blindness 04/06/2015   Annual physical exam 09/12/2014   Vitamin D deficiency 09/12/2014   Essential (primary) hypertension 07/26/2013   Goiter 08/30/2012    PCP: Bartholome Bill  REFERRING PROVIDER: Gretel Acre, MD  REFERRING DIAG: Diagnosis M25.512 (ICD-10-CM) - Acute pain of left shoulder   THERAPY DIAG:  No diagnosis found.  Rationale for Evaluation and Treatment Rehabilitation  ONSET DATE: 10/01/2021   SUBJECTIVE:                                                                                                                                                                                      SUBJECTIVE STATEMENT: Patient reports pain in her shoulder which developed after traveling and then lifting weights, which she normally does, but had taken a break. The arm is very achy, but she had some actual pain when she released her arm. The pain has improved, but she still feels it mildly with shoulder elevation beyond 90.  PERTINENT HISTORY: Physical Exam  well-developed well-nourished female no acute distress.  Alert and oriented x3.   Ortho Exam left shoulder exam reveals forward flexion to approximately 160 degrees.  Full abduction and external rotation.  She can internally rotate to T12.  Negative empty can, cross body abduction and speeds test.  No tenderness to the [facility] joint.  She has full strength throughout.  PAIN:  Are you having pain? Yes: NPRS scale: 3/10 Pain location: L shoulder Pain description: aching Aggravating factors: elevation beyond 90 Relieving factors: avoid elevation   PRECAUTIONS: None  WEIGHT BEARING RESTRICTIONS No  FALLS:  Has patient fallen in last 6 months? No  LIVING ENVIRONMENT: Lives with: lives with their family Lives in: House/apartment No issues with any self care. She  likes to lift weights, but has had to modify. Sleep  OCCUPATION: retired  PLOF: Independent  PATIENT GOALS Learn how to manage her pain, precautions, decrease pain. Prevent return of pain.  OBJECTIVE:   DIAGNOSTIC FINDINGS:  Imaging: XR Shoulder Left   Result Date: 10/01/2021 Prominent hook of the anterior acromial process.   PATIENT SURVEYS:  FOTO 59.41  COGNITION:  Overall cognitive status: Within functional limits for tasks assessed     SENSATION: WFL  POSTURE: Forward head, round shoulders, L scapula mildly winging.  UPPER EXTREMITY ROM: WFL, but patient reports proximal bicep pain with elevation > 90  UPPER EXTREMITY MMT: WFL, sore in bicep with shoulder abd and elbow flexion resistance.  SHOULDER SPECIAL TESTS:  Impingement tests: Neer impingement test: negative, Hawkins/Kennedy impingement test: positive , and Painful arc test: positive all on L   Rotator cuff assessment:   Biceps assessment: Speed's test: positive L  JOINT MOBILITY TESTING:  B shoulder depression and post glide mildly impaired.  PALPATION:  Supraspinatus tendon and bicep tendon TTP on L. General tightness and muscle bulk in B upper  trunk due to her weight lifting.   TODAY'S TREATMENT:  Education   PATIENT EDUCATION: Education details: POC, guidelines for her weight training- no pain in shoulder, limit shoulder elevation > 90, planks of if no pain and she is maintaining the proper alignment. Person educated: Patient Education method: Customer service manager Education comprehension: verbalized understanding   HOME EXERCISE PROGRAM: TBD  ASSESSMENT:  CLINICAL IMPRESSION: Patient is a 71 y.o. who was seen today for physical therapy evaluation and treatment for recent onset of L shoulder pain. She demonstrates TTP in L supraspinatus and biceps tendon, decreased functional LUE strength due to pain, L scapular winging, B pect tightness, positive testing for impingement on L. She will benefit from PT for muscular strength and stabilization of L shoulder to decrease pain and allow return to her previous LOF.   OBJECTIVE IMPAIRMENTS decreased activity tolerance, decreased coordination, decreased ROM, decreased strength, hypomobility, increased fascial restrictions, increased muscle spasms, impaired flexibility, impaired UE functional use, improper body mechanics, postural dysfunction, and pain.   ACTIVITY LIMITATIONS carrying, lifting, and reach over head  PARTICIPATION LIMITATIONS: cleaning, laundry, yard work, and weight lifting  PERSONAL FACTORS Age are also affecting patient's functional outcome.   REHAB POTENTIAL: Good  CLINICAL DECISION MAKING: Stable/uncomplicated  EVALUATION COMPLEXITY: Moderate   GOALS: Goals reviewed with patient? Yes  SHORT TERM GOALS: Target date: 11/11/2021  (Remove Blue Hyperlink)  I with basic HEP Baseline: Goal status: INITIAL  LONG TERM GOALS: Target date 01/06/2022   I with final HEP Baseline:  Goal status: INITIAL  2.  Increase FOTO to at least 70 Baseline:  51 Goal status: INITIAL  3.  Patient will tolerate full shoulder flex and abd without pain Baseline:  3/10 Goal status: INITIAL  4.  Patient will report full return to her strength training class with < 2 compensations due to pain. Baseline: Modifies entire program to avoid pain Goal status: INITIAL  5.  Patient will report the ability to perform all household responsibilities with < 3/10 pain in L shoulder. Baseline: limited activities. Goal status: INITIAL  PLAN: PT FREQUENCY: 1-2x/week  PT DURATION: 10 weeks  PLANNED INTERVENTIONS: Therapeutic exercises, Therapeutic activity, Neuromuscular re-education, Balance training, Gait training, Patient/Family education, Self Care, Joint mobilization, Dry Needling, Electrical stimulation, Cryotherapy, Moist heat, Vasopneumatic device, Ultrasound, Ionotophoresis '4mg'$ /ml Dexamethasone, and Manual therapy  PLAN FOR NEXT SESSION: Initiate HEP, joint mobs for posterior,  inferior glide   Marcelina Morel, DPT 10/14/2021, 4:32 PM

## 2021-10-29 ENCOUNTER — Ambulatory Visit: Payer: Medicare PPO | Admitting: Physical Therapy

## 2021-10-29 ENCOUNTER — Encounter: Payer: Self-pay | Admitting: Physical Therapy

## 2021-10-29 DIAGNOSIS — M6281 Muscle weakness (generalized): Secondary | ICD-10-CM

## 2021-10-29 DIAGNOSIS — R293 Abnormal posture: Secondary | ICD-10-CM

## 2021-10-29 DIAGNOSIS — R600 Localized edema: Secondary | ICD-10-CM

## 2021-10-29 DIAGNOSIS — M25512 Pain in left shoulder: Secondary | ICD-10-CM

## 2021-10-29 NOTE — Therapy (Signed)
OUTPATIENT PHYSICAL THERAPY SHOULDER TREATMENT   Patient Name: Lindsey Krause MRN: [id number] DOB:December 06, 1950, 71 y.o., female Today's Date: 10/29/2021   PT End of Session - 10/29/21 1605     Visit Number 2    Date for PT Re-Evaluation 12/23/21    PT Start Time 1605    PT Stop Time 1645    PT Time Calculation (min) 40 min    Activity Tolerance Patient tolerated treatment well    Behavior During Therapy [facility] for tasks assessed/performed             Past Medical History:  Diagnosis Date   Anxiety    Colitis 12/02/2004   Dr Collene Mares "focal active colitis"   Hyperlipidemia    Hypertension    Palpitations 08/26/2021   Past Surgical History:  Procedure Laterality Date   COLONOSCOPY     Patient Active Problem List   Diagnosis Date Noted   Palpitations 08/26/2021   Postmenopausal bleeding 05/21/2021   Vaginitis 05/21/2021   Trigger thumb, left thumb 02/01/2021   Hypercholesterolemia 05/04/2020   Chest pain due to GERD 03/29/2020   Gastroesophageal reflux disease with esophagitis without hemorrhage 03/29/2020   Shortness of breath 09/17/2017   Uterine leiomyoma 03/04/2017   LLQ pain 01/14/2016   Anxiety 06/14/2015   Glaucoma suspect of both eyes 06/12/2015   Transient monocular blindness 04/06/2015   Annual physical exam 09/12/2014   Vitamin D deficiency 09/12/2014   Essential (primary) hypertension 07/26/2013   Goiter 08/30/2012    PCP: Bartholome Bill  REFERRING PROVIDER: Gretel Acre, MD  REFERRING DIAG: Diagnosis M25.512 (ICD-10-CM) - Acute pain of left shoulder   THERAPY DIAG:  Abnormal posture  Muscle weakness (generalized)  Acute pain of left shoulder  Localized edema  Rationale for Evaluation and Treatment Rehabilitation  ONSET DATE: 10/01/2021   SUBJECTIVE:                                                                                                                                                                                       SUBJECTIVE STATEMENT: Some good and bad days. Painful movements on bad days  PERTINENT HISTORY: Physical Exam well-developed well-nourished female no acute distress.  Alert and oriented x3.   Ortho Exam left shoulder exam reveals forward flexion to approximately 160 degrees.  Full abduction and external rotation.  She can internally rotate to T12.  Negative empty can, cross body abduction and speeds test.  No tenderness to the [facility] joint.  She has full strength throughout.  PAIN:  Are you having pain? Yes: NPRS scale: 0/10 Pain location: L shoulder Pain description: aching Aggravating factors: elevation beyond 90  Relieving factors: avoid elevation   PRECAUTIONS: None  WEIGHT BEARING RESTRICTIONS No  FALLS:  Has patient fallen in last 6 months? No  LIVING ENVIRONMENT: Lives with: lives with their family Lives in: House/apartment No issues with any self care. She likes to lift weights, but has had to modify. Sleep  OCCUPATION: retired  PLOF: Independent  PATIENT GOALS Learn how to manage her pain, precautions, decrease pain. Prevent return of pain.  OBJECTIVE:   DIAGNOSTIC FINDINGS:  Imaging: XR Shoulder Left   Result Date: 10/01/2021 Prominent hook of the anterior acromial process.   PATIENT SURVEYS:  FOTO 59.41  POSTURE: Forward head, round shoulders, L scapula mildly winging.  UPPER EXTREMITY ROM: WFL, but patient reports proximal bicep pain with elevation > 90  UPPER EXTREMITY MMT: WFL, sore in bicep with shoulder abd and elbow flexion resistance.  SHOULDER SPECIAL TESTS:  Impingement tests: Neer impingement test: negative, Hawkins/Kennedy impingement test: positive , and Painful arc test: positive all on L   Rotator cuff assessment:   Biceps assessment: Speed's test: positive L  JOINT MOBILITY TESTING:  B shoulder depression and post glide mildly impaired.  PALPATION:  Supraspinatus tendon and bicep tendon TTP on L. General tightness and  muscle bulk in B upper trunk due to her weight lifting.   TODAY'S TREATMENT:  10/29/21 UBE L1 x 3 min each  AAROM 3lb Wate Flex, Ext, IR x10 Rows & Ext green 2x10 Shoulder Flex 2lb 2x10 Shoulder Abd 1lb 2x10 LUE PROM in all diections  Education   PATIENT EDUCATION: Education details: POC, guidelines for her weight training- no pain in shoulder, limit shoulder elevation > 90, planks of if no pain and she is maintaining the proper alignment. Person educated: Patient Education method: Customer service manager Education comprehension: verbalized understanding   HOME EXERCISE PROGRAM: Access Code: [email or web address] URL: [email or web address] Date: 10/29/2021 Prepared by: Cheri Fowler  Exercises - Standing Row with Resistance  - 1 x daily - 7 x weekly - 3 sets - 10 reps - Standing Shoulder Extension with Resistance  - 1 x daily - 7 x weekly - 3 sets - 10 reps - Standing Shoulder External Rotation with Resistance  - 1 x daily - 7 x weekly - 3 sets - 10 reps  ASSESSMENT:  CLINICAL IMPRESSION: Pt ~ 5 minutes late for today session. She reports good and and bad days in regards to L shoulder aching. Pt tolerated ain initial progression to TE well. Tolerable pain reported with Tband rows. Good  ROM noted with resisted ER. Some shoulder fatigue present with flexion and abduction. Some shoulder tightness present with PROM, pain reported with passive IR  OBJECTIVE IMPAIRMENTS decreased activity tolerance, decreased coordination, decreased ROM, decreased strength, hypomobility, increased fascial restrictions, increased muscle spasms, impaired flexibility, impaired UE functional use, improper body mechanics, postural dysfunction, and pain.   ACTIVITY LIMITATIONS carrying, lifting, and reach over head  PARTICIPATION LIMITATIONS: cleaning, laundry, yard work, and weight lifting  PERSONAL FACTORS Age are also affecting patient's functional outcome.   REHAB POTENTIAL:  Good  CLINICAL DECISION MAKING: Stable/uncomplicated  EVALUATION COMPLEXITY: Moderate   GOALS: Goals reviewed with patient? Yes  SHORT TERM GOALS: Target date: 11/11/2021  (Remove Blue Hyperlink)  I with basic HEP Baseline: Goal status: INITIAL  LONG TERM GOALS: Target date 01/06/2022   I with final HEP Baseline:  Goal status: INITIAL  2.  Increase FOTO to at least 70 Baseline:  51 Goal status: INITIAL  3.  Patient will tolerate full shoulder flex  and abd without pain Baseline: 3/10 Goal status: INITIAL  4.  Patient will report full return to her strength training class with < 2 compensations due to pain. Baseline: Modifies entire program to avoid pain Goal status: INITIAL  5.  Patient will report the ability to perform all household responsibilities with < 3/10 pain in L shoulder. Baseline: limited activities. Goal status: INITIAL  PLAN: PT FREQUENCY: 1-2x/week  PT DURATION: 10 weeks  PLANNED INTERVENTIONS: Therapeutic exercises, Therapeutic activity, Neuromuscular re-education, Balance training, Gait training, Patient/Family education, Self Care, Joint mobilization, Dry Needling, Electrical stimulation, Cryotherapy, Moist heat, Vasopneumatic device, Ultrasound, Ionotophoresis '4mg'$ /ml Dexamethasone, and Manual therapy  PLAN FOR NEXT SESSION: Initiate HEP, joint mobs for posterior, inferior glide   Marcelina Morel, DPT 10/29/2021, 4:05 PM

## 2021-10-31 ENCOUNTER — Encounter: Payer: Self-pay | Admitting: Physical Therapy

## 2021-10-31 ENCOUNTER — Ambulatory Visit: Payer: Medicare PPO | Admitting: Physical Therapy

## 2021-10-31 DIAGNOSIS — M6281 Muscle weakness (generalized): Secondary | ICD-10-CM

## 2021-10-31 DIAGNOSIS — R600 Localized edema: Secondary | ICD-10-CM

## 2021-10-31 DIAGNOSIS — R293 Abnormal posture: Secondary | ICD-10-CM | POA: Diagnosis not present

## 2021-10-31 DIAGNOSIS — R278 Other lack of coordination: Secondary | ICD-10-CM

## 2021-10-31 DIAGNOSIS — M25512 Pain in left shoulder: Secondary | ICD-10-CM

## 2021-10-31 NOTE — Therapy (Signed)
OUTPATIENT PHYSICAL THERAPY SHOULDER TREATMENT   Patient Name: Lindsey Krause MRN: [id number] DOB:1950-11-19, 71 y.o., female Today's Date: 10/31/2021   PT End of Session - 10/31/21 1449     Visit Number 3    Date for PT Re-Evaluation 12/23/21    PT Start Time 1445    PT Stop Time 1525    PT Time Calculation (min) 40 min    Activity Tolerance Patient tolerated treatment well    Behavior During Therapy [facility] for tasks assessed/performed              Past Medical History:  Diagnosis Date   Anxiety    Colitis 12/02/2004   Dr Collene Mares "focal active colitis"   Hyperlipidemia    Hypertension    Palpitations 08/26/2021   Past Surgical History:  Procedure Laterality Date   COLONOSCOPY     Patient Active Problem List   Diagnosis Date Noted   Palpitations 08/26/2021   Postmenopausal bleeding 05/21/2021   Vaginitis 05/21/2021   Trigger thumb, left thumb 02/01/2021   Hypercholesterolemia 05/04/2020   Chest pain due to GERD 03/29/2020   Gastroesophageal reflux disease with esophagitis without hemorrhage 03/29/2020   Shortness of breath 09/17/2017   Uterine leiomyoma 03/04/2017   LLQ pain 01/14/2016   Anxiety 06/14/2015   Glaucoma suspect of both eyes 06/12/2015   Transient monocular blindness 04/06/2015   Annual physical exam 09/12/2014   Vitamin D deficiency 09/12/2014   Essential (primary) hypertension 07/26/2013   Goiter 08/30/2012    PCP: Bartholome Bill  REFERRING PROVIDER: Gretel Acre, MD  REFERRING DIAG: Diagnosis M25.512 (ICD-10-CM) - Acute pain of left shoulder   THERAPY DIAG:  Abnormal posture  Muscle weakness (generalized)  Acute pain of left shoulder  Localized edema  Other lack of coordination  Rationale for Evaluation and Treatment Rehabilitation  ONSET DATE: 10/01/2021   SUBJECTIVE:                                                                                                                                                                                       SUBJECTIVE STATEMENT: Patient reports that her pain ebbs and flows. She is still lifting weights, but has modified it. She did not have any pain.  PERTINENT HISTORY: Physical Exam well-developed well-nourished female no acute distress.  Alert and oriented x3.   Ortho Exam left shoulder exam reveals forward flexion to approximately 160 degrees.  Full abduction and external rotation.  She can internally rotate to T12.  Negative empty can, cross body abduction and speeds test.  No tenderness to the [facility] joint.  She has full strength throughout.  PAIN:  Are  you having pain? Yes: NPRS scale: 5/10 Pain location: L shoulder Pain description: aching Aggravating factors: elevation beyond 90 Relieving factors: avoid elevation   PRECAUTIONS: None  WEIGHT BEARING RESTRICTIONS No  FALLS:  Has patient fallen in last 6 months? No  LIVING ENVIRONMENT: Lives with: lives with their family Lives in: House/apartment No issues with any self care. She likes to lift weights, but has had to modify. Sleep  OCCUPATION: retired  PLOF: Independent  PATIENT GOALS Learn how to manage her pain, precautions, decrease pain. Prevent return of pain.  OBJECTIVE:   DIAGNOSTIC FINDINGS:  Imaging: XR Shoulder Left   Result Date: 10/01/2021 Prominent hook of the anterior acromial process.   PATIENT SURVEYS:  FOTO 59.41  POSTURE: Forward head, round shoulders, L scapula mildly winging.  UPPER EXTREMITY ROM: WFL, but patient reports proximal bicep pain with elevation > 90  UPPER EXTREMITY MMT: WFL, sore in bicep with shoulder abd and elbow flexion resistance.  SHOULDER SPECIAL TESTS:  Impingement tests: Neer impingement test: negative, Hawkins/Kennedy impingement test: positive , and Painful arc test: positive all on L   Rotator cuff assessment:   Biceps assessment: Speed's test: positive L  JOINT MOBILITY TESTING:  B shoulder depression and post glide  mildly impaired.  PALPATION:  Supraspinatus tendon and bicep tendon TTP on L. General tightness and muscle bulk in B upper trunk due to her weight lifting.   TODAY'S TREATMENT:  10/31/21 UBE L1 3 minutes forward/3 minutes back Seated push ups, emphasizing shoulder depression, x 10 L shoulder joint mobs into depression and post glide grade III 2 x 10 each Sh ext, rows 2 x 10 each, 15# Sh ER 10#, 2 x 10 reps Wall slides into flex, scaption, abduction with towel, 10 each Shoulder flexion, ext, abd with 3# WATE bar, 10 reps each Vaso x 10 minutes to L shoulder 34, min pressure.  10/29/21 UBE L1 x 3 min each  AAROM 3lb Wate Flex, Ext, IR x10 Rows & Ext green 2x10 Shoulder Flex 2lb 2x10 Shoulder Abd 1lb 2x10 LUE PROM in all diections  Education   PATIENT EDUCATION: Education details: POC, guidelines for her weight training- no pain in shoulder, limit shoulder elevation > 90, planks of if no pain and she is maintaining the proper alignment. Person educated: Patient Education method: Customer service manager Education comprehension: verbalized understanding   HOME EXERCISE PROGRAM: Access Code: [email or web address] URL: [email or web address] Date: 10/29/2021 Prepared by: Cheri Fowler  Exercises - Standing Row with Resistance  - 1 x daily - 7 x weekly - 3 sets - 10 reps - Standing Shoulder Extension with Resistance  - 1 x daily - 7 x weekly - 3 sets - 10 reps - Standing Shoulder External Rotation with Resistance  - 1 x daily - 7 x weekly - 3 sets - 10 reps  ASSESSMENT:  CLINICAL IMPRESSION: Patient reports continued fluctuation in pain level. Treatment emphasized activation of scapular stabilizers, then performed active shoulder elevation for strength and ROM. Educated patient to appropriate lifting techniques.  OBJECTIVE IMPAIRMENTS decreased activity tolerance, decreased coordination, decreased ROM, decreased strength, hypomobility, increased fascial restrictions,  increased muscle spasms, impaired flexibility, impaired UE functional use, improper body mechanics, postural dysfunction, and pain.   ACTIVITY LIMITATIONS carrying, lifting, and reach over head  PARTICIPATION LIMITATIONS: cleaning, laundry, yard work, and weight lifting  PERSONAL FACTORS Age are also affecting patient's functional outcome.   REHAB POTENTIAL: Good  CLINICAL DECISION MAKING: Stable/uncomplicated  EVALUATION COMPLEXITY: Moderate   GOALS: Goals reviewed with  patient? Yes  SHORT TERM GOALS: Target date: 11/11/2021  (Remove Blue Hyperlink)  I with basic HEP Baseline: Goal status: met  LONG TERM GOALS: Target date 01/06/2022   I with final HEP Baseline:  Goal status: INITIAL  2.  Increase FOTO to at least 70 Baseline:  51 Goal status: INITIAL  3.  Patient will tolerate full shoulder flex and abd without pain Baseline: 3/10 Goal status: ongoing  4.  Patient will report full return to her strength training class with < 2 compensations due to pain. Baseline: Modifies entire program to avoid pain Goal status: INITIAL  5.  Patient will report the ability to perform all household responsibilities with < 3/10 pain in L shoulder. Baseline: limited activities. Goal status: INITIAL  PLAN: PT FREQUENCY: 1-2x/week  PT DURATION: 10 weeks  PLANNED INTERVENTIONS: Therapeutic exercises, Therapeutic activity, Neuromuscular re-education, Balance training, Gait training, Patient/Family education, Self Care, Joint mobilization, Dry Needling, Electrical stimulation, Cryotherapy, Moist heat, Vasopneumatic device, Ultrasound, Ionotophoresis 4mg /ml Dexamethasone, and Manual therapy  PLAN FOR NEXT SESSION: Initiate HEP, joint mobs for posterior, inferior glide   Marcelina Morel, DPT 10/31/2021, 3:23 PM

## 2021-11-05 ENCOUNTER — Encounter: Payer: Self-pay | Admitting: Physical Therapy

## 2021-11-05 ENCOUNTER — Ambulatory Visit: Payer: Medicare PPO | Attending: Physician Assistant | Admitting: Physical Therapy

## 2021-11-05 DIAGNOSIS — R600 Localized edema: Secondary | ICD-10-CM | POA: Insufficient documentation

## 2021-11-05 DIAGNOSIS — R278 Other lack of coordination: Secondary | ICD-10-CM | POA: Diagnosis present

## 2021-11-05 DIAGNOSIS — M25512 Pain in left shoulder: Secondary | ICD-10-CM | POA: Insufficient documentation

## 2021-11-05 DIAGNOSIS — R293 Abnormal posture: Secondary | ICD-10-CM | POA: Diagnosis present

## 2021-11-05 DIAGNOSIS — M6281 Muscle weakness (generalized): Secondary | ICD-10-CM | POA: Insufficient documentation

## 2021-11-05 NOTE — Therapy (Signed)
OUTPATIENT PHYSICAL THERAPY SHOULDER TREATMENT   Patient Name: Lindsey Krause MRN: [id number] DOB:1950/09/07, 71 y.o., female Today's Date: 11/05/2021   PT End of Session - 11/05/21 1556     Visit Number 4    Date for PT Re-Evaluation 12/23/21    PT Start Time 1600    PT Stop Time 1645    PT Time Calculation (min) 45 min    Activity Tolerance Patient tolerated treatment well    Behavior During Therapy [facility] for tasks assessed/performed              Past Medical History:  Diagnosis Date   Anxiety    Colitis 12/02/2004   Dr Collene Mares "focal active colitis"   Hyperlipidemia    Hypertension    Palpitations 08/26/2021   Past Surgical History:  Procedure Laterality Date   COLONOSCOPY     Patient Active Problem List   Diagnosis Date Noted   Palpitations 08/26/2021   Postmenopausal bleeding 05/21/2021   Vaginitis 05/21/2021   Trigger thumb, left thumb 02/01/2021   Hypercholesterolemia 05/04/2020   Chest pain due to GERD 03/29/2020   Gastroesophageal reflux disease with esophagitis without hemorrhage 03/29/2020   Shortness of breath 09/17/2017   Uterine leiomyoma 03/04/2017   LLQ pain 01/14/2016   Anxiety 06/14/2015   Glaucoma suspect of both eyes 06/12/2015   Transient monocular blindness 04/06/2015   Annual physical exam 09/12/2014   Vitamin D deficiency 09/12/2014   Essential (primary) hypertension 07/26/2013   Goiter 08/30/2012    PCP: Bartholome Bill  REFERRING PROVIDER: Gretel Acre, MD  REFERRING DIAG: Diagnosis M25.512 (ICD-10-CM) - Acute pain of left shoulder   THERAPY DIAG:  Abnormal posture  Muscle weakness (generalized)  Acute pain of left shoulder  Rationale for Evaluation and Treatment Rehabilitation  ONSET DATE: 10/01/2021   SUBJECTIVE:                                                                                                                                                                                       SUBJECTIVE STATEMENT: "This is a good day, not mush pain" Has not worked out the last two days  PERTINENT HISTORY: Physical Exam well-developed well-nourished female no acute distress.  Alert and oriented x3.   Ortho Exam left shoulder exam reveals forward flexion to approximately 160 degrees.  Full abduction and external rotation.  She can internally rotate to T12.  Negative empty can, cross body abduction and speeds test.  No tenderness to the [employer] [facility] joint.  She has full strength throughout.  PAIN:  Are you having pain? Yes: NPRS scale: 0/10 Pain location: L shoulder Pain description: aching Aggravating  factors: elevation beyond 90 Relieving factors: avoid elevation   PRECAUTIONS: None  WEIGHT BEARING RESTRICTIONS No  FALLS:  Has patient fallen in last 6 months? No  LIVING ENVIRONMENT: Lives with: lives with their family Lives in: House/apartment No issues with any self care. She likes to lift weights, but has had to modify. Sleep  OCCUPATION: retired  PLOF: Independent  PATIENT GOALS Learn how to manage her pain, precautions, decrease pain. Prevent return of pain.  OBJECTIVE:   DIAGNOSTIC FINDINGS:  Imaging: XR Shoulder Left   Result Date: 10/01/2021 Prominent hook of the anterior acromial process.   PATIENT SURVEYS:  FOTO 59.41  POSTURE: Forward head, round shoulders, L scapula mildly winging.  UPPER EXTREMITY ROM: WFL, but patient reports proximal bicep pain with elevation > 90  UPPER EXTREMITY MMT: WFL, sore in bicep with shoulder abd and elbow flexion resistance.  SHOULDER SPECIAL TESTS:  Impingement tests: Neer impingement test: negative, Hawkins/Kennedy impingement test: positive , and Painful arc test: positive all on L   Rotator cuff assessment:   Biceps assessment: Speed's test: positive L  JOINT MOBILITY TESTING:  B shoulder depression and post glide mildly impaired.  PALPATION:  Supraspinatus tendon and bicep tendon TTP on L. General tightness  and muscle bulk in B upper trunk due to her weight lifting.   TODAY'S TREATMENT:  11/05/21 UBE L1 x 3 min each  Rows 15lb 2x10 Lats 15lb 2x10 Shoulder ER red 2x10 Shoulder flex 2lb 2x10 Shoulder Abd 1lb 2x10 Horz abd red 2x10 Triceps ext 20lb 2x15   LUE PROM with end range holds all diectons  10/31/21 UBE L1 3 minutes forward/3 minutes back Seated push ups, emphasizing shoulder depression, x 10 L shoulder joint mobs into depression and post glide grade III 2 x 10 each Sh ext, rows 2 x 10 each, 15# Sh ER 10#, 2 x 10 reps Wall slides into flex, scaption, abduction with towel, 10 each Shoulder flexion, ext, abd with 3# WATE bar, 10 reps each Vaso x 10 minutes to L shoulder 34, min pressure.  10/29/21 UBE L1 x 3 min each  AAROM 3lb Wate Flex, Ext, IR x10 Rows & Ext green 2x10 Shoulder Flex 2lb 2x10 Shoulder Abd 1lb 2x10 LUE PROM in all diections  Education   PATIENT EDUCATION: Education details: POC, guidelines for her weight training- no pain in shoulder, limit shoulder elevation > 90, planks of if no pain and she is maintaining the proper alignment. Person educated: Patient Education method: Customer service manager Education comprehension: verbalized understanding   HOME EXERCISE PROGRAM: Access Code: [email or web address] URL: [email or web address] Date: 10/29/2021 Prepared by: Cheri Fowler  Exercises - Standing Row with Resistance  - 1 x daily - 7 x weekly - 3 sets - 10 reps - Standing Shoulder Extension with Resistance  - 1 x daily - 7 x weekly - 3 sets - 10 reps - Standing Shoulder External Rotation with Resistance  - 1 x daily - 7 x weekly - 3 sets - 10 reps  ASSESSMENT:  CLINICAL IMPRESSION: Patient enters with reports of being today was a good day. Session focused on scapular and postural strengthening. All interventions completed well with no reports of increase pain. Cue needed to complete full ROM with ER. Cues needed to control eccentric phase  of shoulder Ext.   OBJECTIVE IMPAIRMENTS decreased activity tolerance, decreased coordination, decreased ROM, decreased strength, hypomobility, increased fascial restrictions, increased muscle spasms, impaired flexibility, impaired UE functional use, improper body mechanics, postural dysfunction, and pain.  ACTIVITY LIMITATIONS carrying, lifting, and reach over head  PARTICIPATION LIMITATIONS: cleaning, laundry, yard work, and weight lifting  PERSONAL FACTORS Age are also affecting patient's functional outcome.   REHAB POTENTIAL: Good  CLINICAL DECISION MAKING: Stable/uncomplicated  EVALUATION COMPLEXITY: Moderate   GOALS: Goals reviewed with patient? Yes  SHORT TERM GOALS: Target date: 11/11/2021  (Remove Blue Hyperlink)  I with basic HEP Baseline: Goal status: met  LONG TERM GOALS: Target date 01/06/2022   I with final HEP Baseline:  Goal status: INITIAL  2.  Increase FOTO to at least 70 Baseline:  51 Goal status: INITIAL  3.  Patient will tolerate full shoulder flex and abd without pain Baseline: 3/10 Goal status: ongoing  4.  Patient will report full return to her strength training class with < 2 compensations due to pain. Baseline: Modifies entire program to avoid pain Goal status: INITIAL  5.  Patient will report the ability to perform all household responsibilities with < 3/10 pain in L shoulder. Baseline: limited activities. Goal status: INITIAL  PLAN: PT FREQUENCY: 1-2x/week  PT DURATION: 10 weeks  PLANNED INTERVENTIONS: Therapeutic exercises, Therapeutic activity, Neuromuscular re-education, Balance training, Gait training, Patient/Family education, Self Care, Joint mobilization, Dry Needling, Electrical stimulation, Cryotherapy, Moist heat, Vasopneumatic device, Ultrasound, Ionotophoresis 24m/ml Dexamethasone, and Manual therapy  PLAN FOR NEXT SESSION: Initiate HEP, joint mobs for posterior, inferior glide   RCheri Fowler PTA 11/05/2021, 3:59 PM

## 2021-11-07 ENCOUNTER — Ambulatory Visit: Payer: Medicare PPO | Admitting: Physical Therapy

## 2021-11-07 DIAGNOSIS — M25512 Pain in left shoulder: Secondary | ICD-10-CM

## 2021-11-07 DIAGNOSIS — M6281 Muscle weakness (generalized): Secondary | ICD-10-CM

## 2021-11-07 DIAGNOSIS — R293 Abnormal posture: Secondary | ICD-10-CM | POA: Diagnosis not present

## 2021-11-07 DIAGNOSIS — R600 Localized edema: Secondary | ICD-10-CM

## 2021-11-07 DIAGNOSIS — R278 Other lack of coordination: Secondary | ICD-10-CM

## 2021-11-07 NOTE — Therapy (Signed)
OUTPATIENT PHYSICAL THERAPY SHOULDER TREATMENT   Patient Name: Lindsey Krause MRN: [id number] DOB:[birth year], 71 y.o., female Today's Date: 11/07/2021   PT End of Session - 11/07/21 1538     Visit Number 5    Date for PT Re-Evaluation 12/23/21    PT Start Time 1533    PT Stop Time 1612    PT Time Calculation (min) 39 min    Activity Tolerance Patient tolerated treatment well    Behavior During Therapy [facility] for tasks assessed/performed               Past Medical History:  Diagnosis Date   Anxiety    Colitis 12/02/2004   Dr Collene Mares "focal active colitis"   Hyperlipidemia    Hypertension    Palpitations 08/26/2021   Past Surgical History:  Procedure Laterality Date   COLONOSCOPY     Patient Active Problem List   Diagnosis Date Noted   Palpitations 08/26/2021   Postmenopausal bleeding 05/21/2021   Vaginitis 05/21/2021   Trigger thumb, left thumb 02/01/2021   Hypercholesterolemia 05/04/2020   Chest pain due to GERD 03/29/2020   Gastroesophageal reflux disease with esophagitis without hemorrhage 03/29/2020   Shortness of breath 09/17/2017   Uterine leiomyoma 03/04/2017   LLQ pain 01/14/2016   Anxiety 06/14/2015   Glaucoma suspect of both eyes 06/12/2015   Transient monocular blindness 04/06/2015   Annual physical exam 09/12/2014   Vitamin D deficiency 09/12/2014   Essential (primary) hypertension 07/26/2013   Goiter 08/30/2012    PCP: Bartholome Bill  REFERRING PROVIDER: Gretel Acre, MD  REFERRING DIAG: Diagnosis M25.512 (ICD-10-CM) - Acute pain of left shoulder   THERAPY DIAG:  Abnormal posture  Muscle weakness (generalized)  Acute pain of left shoulder  Localized edema  Other lack of coordination  Rationale for Evaluation and Treatment Rehabilitation  ONSET DATE: 10/01/2021   SUBJECTIVE:                                                                                                                                                                                       SUBJECTIVE STATEMENT: Patient reports she went to the gym today and did some light weights without any pain, using caution to avoid painful positions.  PERTINENT HISTORY: Physical Exam well-developed well-nourished female no acute distress.  Alert and oriented x3.   Ortho Exam left shoulder exam reveals forward flexion to approximately 160 degrees.  Full abduction and external rotation.  She can internally rotate to T12.  Negative empty can, cross body abduction and speeds test.  No tenderness to the [facility] joint.  She has full strength throughout.  PAIN:  Are  you having pain? Yes: NPRS scale: 0/10 Pain location: L shoulder Pain description: aching Aggravating factors: elevation beyond 90 Relieving factors: avoid elevation   PRECAUTIONS: None  WEIGHT BEARING RESTRICTIONS No  FALLS:  Has patient fallen in last 6 months? No  LIVING ENVIRONMENT: Lives with: lives with their family Lives in: House/apartment No issues with any self care. She likes to lift weights, but has had to modify. Sleep  OCCUPATION: retired  PLOF: Independent  PATIENT GOALS Learn how to manage her pain, precautions, decrease pain. Prevent return of pain.  OBJECTIVE:   DIAGNOSTIC FINDINGS:  Imaging: XR Shoulder Left   Result Date: 10/01/2021 Prominent hook of the anterior acromial process.   PATIENT SURVEYS:  FOTO 59.41  POSTURE: Forward head, round shoulders, L scapula mildly winging.  UPPER EXTREMITY ROM: WFL, but patient reports proximal bicep pain with elevation > 90  UPPER EXTREMITY MMT: WFL, sore in bicep with shoulder abd and elbow flexion resistance.  SHOULDER SPECIAL TESTS:  Impingement tests: Neer impingement test: negative, Hawkins/Kennedy impingement test: positive , and Painful arc test: positive all on L   Rotator cuff assessment:   Biceps assessment: Speed's test: positive L  JOINT MOBILITY TESTING:  B shoulder depression and post  glide mildly impaired.  PALPATION:  Supraspinatus tendon and bicep tendon TTP on L. General tightness and muscle bulk in B upper trunk due to her weight lifting.   TODAY'S TREATMENT:  11/07/21 UBE L1.2 x 3 minutes each direction P stretch into L shoulder flexion, abd, IR, ER, hold x 5 sec Active stretch sliding up wall into flex, scaption, abd, 3 x 5 seconds. Push ball into wall, 6 pushes in clock face pattern, 2 x 3 sets Wall stars with yellow Tband resistance, 3 points, 4 reps each arm Shoulder flexion with Serratus activation, Yellow Tband x 5 reps Vaso x 10 minutes for L shoulder 34 degrees, low pressure   11/05/21 UBE L1 x 3 min each  Rows 15lb 2x10 Lats 15lb 2x10 Shoulder ER red 2x10 Shoulder flex 2lb 2x10 Shoulder Abd 1lb 2x10 Horz abd red 2x10 Triceps ext 20lb 2x15   LUE PROM with end range holds all directons  10/31/21 UBE L1 3 minutes forward/3 minutes back Seated push ups, emphasizing shoulder depression, x 10 L shoulder joint mobs into depression and post glide grade III 2 x 10 each Sh ext, rows 2 x 10 each, 15# Sh ER 10#, 2 x 10 reps Wall slides into flex, scaption, abduction with towel, 10 each Shoulder flexion, ext, abd with 3# WATE bar, 10 reps each Vaso x 10 minutes to L shoulder 34, min pressure.  10/29/21 UBE L1 x 3 min each  AAROM 3lb Wate Flex, Ext, IR x10 Rows & Ext green 2x10 Shoulder Flex 2lb 2x10 Shoulder Abd 1lb 2x10 LUE PROM in all diections  Education   PATIENT EDUCATION: Education details: POC, guidelines for her weight training- no pain in shoulder, limit shoulder elevation > 90, planks of if no pain and she is maintaining the proper alignment. Person educated: Patient Education method: Explanation and Demonstration Education comprehension: verbalized understanding   HOME EXERCISE PROGRAM: Access Code: [email or web address] URL: [email or web address] Date: 10/29/2021 Prepared by: Ronald Pemberton  Exercises - Standing Row with  Resistance  - 1 x daily - 7 x weekly - 3 sets - 10 reps - Standing Shoulder Extension with Resistance  - 1 x daily - 7 x weekly - 3 sets - 10 reps - Standing Shoulder External Rotation with Resistance  -   1 x daily - 7 x weekly - 3 sets - 10 reps  ASSESSMENT:  CLINICAL IMPRESSION: Patient reports that she went to they gym today and used light weights in a very cautious manner. She was able to lift overhead somewhat without pain, backed off if she did feel any twinge. Treatment focused on ROM and then shoulder and scapular strength and stability. Finished with Vaso for pain relief.  OBJECTIVE IMPAIRMENTS decreased activity tolerance, decreased coordination, decreased ROM, decreased strength, hypomobility, increased fascial restrictions, increased muscle spasms, impaired flexibility, impaired UE functional use, improper body mechanics, postural dysfunction, and pain.   ACTIVITY LIMITATIONS carrying, lifting, and reach over head  PARTICIPATION LIMITATIONS: cleaning, laundry, yard work, and weight lifting  PERSONAL FACTORS Age are also affecting patient's functional outcome.   REHAB POTENTIAL: Good  CLINICAL DECISION MAKING: Stable/uncomplicated  EVALUATION COMPLEXITY: Moderate   GOALS: Goals reviewed with patient? Yes  SHORT TERM GOALS: Target date: 11/11/2021  (Remove Blue Hyperlink)  I with basic HEP Baseline: Goal status: met  LONG TERM GOALS: Target date 01/06/2022   I with final HEP Baseline:  Goal status: INITIAL  2.  Increase FOTO to at least 70 Baseline:  51 Goal status: INITIAL  3.  Patient will tolerate full shoulder flex and abd without pain Baseline: 3/10 Goal status: ongoing  4.  Patient will report full return to her strength training class with < 2 compensations due to pain. Baseline: Modifies entire program to avoid pain Goal status: ongoing  5.  Patient will report the ability to perform all household responsibilities with < 3/10 pain in L  shoulder. Baseline: limited activities. Goal status: ongoing  PLAN: PT FREQUENCY: 1-2x/week  PT DURATION: 10 weeks  PLANNED INTERVENTIONS: Therapeutic exercises, Therapeutic activity, Neuromuscular re-education, Balance training, Gait training, Patient/Family education, Self Care, Joint mobilization, Dry Needling, Electrical stimulation, Cryotherapy, Moist heat, Vasopneumatic device, Ultrasound, Ionotophoresis 29m/ml Dexamethasone, and Manual therapy  PLAN FOR NEXT SESSION: Initiate HEP, joint mobs for posterior, inferior glide   RCheri Fowler PTA 11/07/2021, 4:09 PM

## 2021-11-13 ENCOUNTER — Ambulatory Visit: Payer: Medicare PPO | Admitting: Physical Therapy

## 2021-11-14 ENCOUNTER — Ambulatory Visit: Payer: Medicare PPO | Admitting: Physical Therapy

## 2021-11-14 ENCOUNTER — Encounter: Payer: Self-pay | Admitting: Physical Therapy

## 2021-11-14 DIAGNOSIS — R600 Localized edema: Secondary | ICD-10-CM

## 2021-11-14 DIAGNOSIS — M6281 Muscle weakness (generalized): Secondary | ICD-10-CM

## 2021-11-14 DIAGNOSIS — M25512 Pain in left shoulder: Secondary | ICD-10-CM

## 2021-11-14 DIAGNOSIS — R293 Abnormal posture: Secondary | ICD-10-CM | POA: Diagnosis not present

## 2021-11-14 DIAGNOSIS — R278 Other lack of coordination: Secondary | ICD-10-CM

## 2021-11-14 NOTE — Therapy (Signed)
OUTPATIENT PHYSICAL THERAPY SHOULDER TREATMENT   Patient Name: Lindsey Krause MRN: [id number] DOB:December 06, 1950, 71 y.o., female Today's Date: 11/14/2021   PT End of Session - 11/14/21 1320     Visit Number 6    Date for PT Re-Evaluation 12/23/21    PT Start Time 1318    PT Stop Time 1356    PT Time Calculation (min) 38 min    Activity Tolerance Patient tolerated treatment well    Behavior During Therapy [facility] for tasks assessed/performed                Past Medical History:  Diagnosis Date   Anxiety    Colitis 12/02/2004   Dr Collene Mares "focal active colitis"   Hyperlipidemia    Hypertension    Palpitations 08/26/2021   Past Surgical History:  Procedure Laterality Date   COLONOSCOPY     Patient Active Problem List   Diagnosis Date Noted   Palpitations 08/26/2021   Postmenopausal bleeding 05/21/2021   Vaginitis 05/21/2021   Trigger thumb, left thumb 02/01/2021   Hypercholesterolemia 05/04/2020   Chest pain due to GERD 03/29/2020   Gastroesophageal reflux disease with esophagitis without hemorrhage 03/29/2020   Shortness of breath 09/17/2017   Uterine leiomyoma 03/04/2017   LLQ pain 01/14/2016   Anxiety 06/14/2015   Glaucoma suspect of both eyes 06/12/2015   Transient monocular blindness 04/06/2015   Annual physical exam 09/12/2014   Vitamin D deficiency 09/12/2014   Essential (primary) hypertension 07/26/2013   Goiter 08/30/2012    PCP: Bartholome Bill  REFERRING PROVIDER: Gretel Acre, MD  REFERRING DIAG: Diagnosis M25.512 (ICD-10-CM) - Acute pain of left shoulder   THERAPY DIAG:  Abnormal posture  Muscle weakness (generalized)  Acute pain of left shoulder  Localized edema  Other lack of coordination  Rationale for Evaluation and Treatment Rehabilitation  ONSET DATE: 10/01/2021   SUBJECTIVE:                                                                                                                                                                                       SUBJECTIVE STATEMENT: Patient reports she has been sore today. She went to the gym and performed some weight training. She has not been performing her HEP due to being busy.  PERTINENT HISTORY: Physical Exam well-developed well-nourished female no acute distress.  Alert and oriented x3.   Ortho Exam left shoulder exam reveals forward flexion to approximately 160 degrees.  Full abduction and external rotation.  She can internally rotate to T12.  Negative empty can, cross body abduction and speeds test.  No tenderness to the [facility] joint.  She has  full strength throughout.  PAIN:  Are you having pain? Yes: NPRS scale: 0/10 Pain location: L shoulder Pain description: aching Aggravating factors: elevation beyond 90 Relieving factors: avoid elevation   PRECAUTIONS: None  WEIGHT BEARING RESTRICTIONS No  FALLS:  Has patient fallen in last 6 months? No  LIVING ENVIRONMENT: Lives with: lives with their family Lives in: House/apartment No issues with any self care. She likes to lift weights, but has had to modify. Sleep  OCCUPATION: retired  PLOF: Independent  PATIENT GOALS Learn how to manage her pain, precautions, decrease pain. Prevent return of pain.  OBJECTIVE:   DIAGNOSTIC FINDINGS:  Imaging: XR Shoulder Left   Result Date: 10/01/2021 Prominent hook of the anterior acromial process.   PATIENT SURVEYS:  FOTO 59.41  POSTURE: Forward head, round shoulders, L scapula mildly winging.  UPPER EXTREMITY ROM: WFL, but patient reports proximal bicep pain with elevation > 90  UPPER EXTREMITY MMT: WFL, sore in bicep with shoulder abd and elbow flexion resistance.  SHOULDER SPECIAL TESTS:  Impingement tests: Neer impingement test: negative, Hawkins/Kennedy impingement test: positive , and Painful arc test: positive all on L   Rotator cuff assessment:   Biceps assessment: Speed's test: positive L  JOINT MOBILITY TESTING:  B  shoulder depression and post glide mildly impaired.  PALPATION:  Supraspinatus tendon and bicep tendon TTP on L. General tightness and muscle bulk in B upper trunk due to her weight lifting.   TODAY'S TREATMENT:  11/14/21 UBE L2 3 min for/back STM to L suprapinatus tendon-cross friction, L pects, F/B stretch into flex, abd, ER L shoulder joint mobs into depression and post glide, 2 x 10 grade III 3 way dumbell lift with 4# weights- VC for upright posture throughout. Prone over physioball with 1# weights, horizontal abd x 10, hold x 10, scaption x 10, ext x 10, 90 IR/ER x 10 each. Ionto to L supriaspinatus, 4ML Dexomethasone x 4 hours, educated to precautions.  11/07/21 UBE L1.2 x 3 minutes each direction P stretch into L shoulder flexion, abd, IR, ER, hold x 5 sec Active stretch sliding up wall into flex, scaption, abd, 3 x 5 seconds. Push ball into wall, 6 pushes in clock face pattern, 2 x 3 sets Wall stars with yellow Tband resistance, 3 points, 4 reps each arm Shoulder flexion with Serratus activation, Yellow Tband x 5 reps Vaso x 10 minutes for L shoulder 34 degrees, low pressure   11/05/21 UBE L1 x 3 min each  Rows 15lb 2x10 Lats 15lb 2x10 Shoulder ER red 2x10 Shoulder flex 2lb 2x10 Shoulder Abd 1lb 2x10 Horz abd red 2x10 Triceps ext 20lb 2x15   LUE PROM with end range holds all directons  10/31/21 UBE L1 3 minutes forward/3 minutes back Seated push ups, emphasizing shoulder depression, x 10 L shoulder joint mobs into depression and post glide grade III 2 x 10 each Sh ext, rows 2 x 10 each, 15# Sh ER 10#, 2 x 10 reps Wall slides into flex, scaption, abduction with towel, 10 each Shoulder flexion, ext, abd with 3# WATE bar, 10 reps each Vaso x 10 minutes to L shoulder 34, min pressure.  10/29/21 UBE L1 x 3 min each  AAROM 3lb Wate Flex, Ext, IR x10 Rows & Ext green 2x10 Shoulder Flex 2lb 2x10 Shoulder Abd 1lb 2x10 LUE PROM in all diections  Education   PATIENT  EDUCATION: Education details: POC, guidelines for her weight training- no pain in shoulder, limit shoulder elevation > 90, planks of  if no pain and she is maintaining the proper alignment. Person educated: Patient Education method: Customer service manager Education comprehension: verbalized understanding   HOME EXERCISE PROGRAM: Access Code: [email or web address] URL: [email or web address] Date: 10/29/2021 Prepared by: Cheri Fowler  Exercises - Standing Row with Resistance  - 1 x daily - 7 x weekly - 3 sets - 10 reps - Standing Shoulder Extension with Resistance  - 1 x daily - 7 x weekly - 3 sets - 10 reps - Standing Shoulder External Rotation with Resistance  - 1 x daily - 7 x weekly - 3 sets - 10 reps  ASSESSMENT:  CLINICAL IMPRESSION: Patient reports some increased lateral shoulder pain today. Eduated the patient avoid overhead lifitng if it hurts shoulder, encouraged her to perform Hep to build scapular stability.  OBJECTIVE IMPAIRMENTS decreased activity tolerance, decreased coordination, decreased ROM, decreased strength, hypomobility, increased fascial restrictions, increased muscle spasms, impaired flexibility, impaired UE functional use, improper body mechanics, postural dysfunction, and pain.   ACTIVITY LIMITATIONS carrying, lifting, and reach over head  PARTICIPATION LIMITATIONS: cleaning, laundry, yard work, and weight lifting  PERSONAL FACTORS Age are also affecting patient's functional outcome.   REHAB POTENTIAL: Good  CLINICAL DECISION MAKING: Stable/uncomplicated  EVALUATION COMPLEXITY: Moderate   GOALS: Goals reviewed with patient? Yes  SHORT TERM GOALS: Target date: 11/11/2021  (Remove Blue Hyperlink)  I with basic HEP Baseline: Goal status: met  LONG TERM GOALS: Target date 01/06/2022   I with final HEP Baseline:  Goal status: INITIAL  2.  Increase FOTO to at least 70 Baseline:  51 Goal status: INITIAL  3.  Patient will tolerate full  shoulder flex and abd without pain Baseline: 3/10 Goal status: ongoing  4.  Patient will report full return to her strength training class with < 2 compensations due to pain. Baseline: Modifies entire program to avoid pain Goal status: ongoing  5.  Patient will report the ability to perform all household responsibilities with < 3/10 pain in L shoulder. Baseline: limited activities. Goal status: ongoing  PLAN: PT FREQUENCY: 1-2x/week  PT DURATION: 10 weeks  PLANNED INTERVENTIONS: Therapeutic exercises, Therapeutic activity, Neuromuscular re-education, Balance training, Gait training, Patient/Family education, Self Care, Joint mobilization, Dry Needling, Electrical stimulation, Cryotherapy, Moist heat, Vasopneumatic device, Ultrasound, Ionotophoresis 4mg /ml Dexamethasone, and Manual therapy  PLAN FOR NEXT SESSION: Initiate HEP, joint mobs for posterior, inferior glide  Ethel Rana DPT 11/14/21 1:59 PM  11/14/2021, 1:59 PM

## 2021-11-18 ENCOUNTER — Ambulatory Visit: Payer: Medicare PPO | Admitting: Physical Therapy

## 2021-11-20 ENCOUNTER — Ambulatory Visit: Payer: Medicare PPO | Admitting: Physical Therapy

## 2021-11-29 LAB — LIPID PANEL
Chol/HDL Ratio: 2.2 ratio (ref 0.0–4.4)
Cholesterol, Total: 181 mg/dL (ref 100–199)
HDL: 82 mg/dL (ref >39)
LDL Chol Calc (NIH): 87 mg/dL (ref 0–99)
Triglycerides: 65 mg/dL (ref 0–149)
VLDL Cholesterol Cal: 12 mg/dL (ref 5–40)

## 2021-11-29 LAB — COMPREHENSIVE METABOLIC PANEL
ALT: 10 IU/L (ref 0–32)
AST: 15 IU/L (ref 0–40)
Albumin/Globulin Ratio: 1.6 (ref 1.2–2.2)
Albumin: 4.2 g/dL (ref 3.9–4.9)
Alkaline Phosphatase: 99 IU/L (ref 44–121)
BUN/Creatinine Ratio: 14 (ref 12–28)
BUN: 15 mg/dL (ref 8–27)
Bilirubin Total: 0.2 mg/dL (ref 0.0–1.2)
CO2: 24 mmol/L (ref 20–29)
Calcium: 9.6 mg/dL (ref 8.7–10.3)
Chloride: 102 mmol/L (ref 96–106)
Creatinine, Ser: 1.07 mg/dL — ABNORMAL HIGH (ref 0.57–1.00)
Globulin, Total: 2.6 g/dL (ref 1.5–4.5)
Glucose: 90 mg/dL (ref 70–99)
Potassium: 4.5 mmol/L (ref 3.5–5.2)
Sodium: 141 mmol/L (ref 134–144)
Total Protein: 6.8 g/dL (ref 6.0–8.5)
eGFR: 56 mL/min/{1.73_m2} — ABNORMAL LOW (ref >59)

## 2021-12-02 ENCOUNTER — Telehealth (HOSPITAL_BASED_OUTPATIENT_CLINIC_OR_DEPARTMENT_OTHER): Payer: Self-pay

## 2021-12-02 DIAGNOSIS — E782 Mixed hyperlipidemia: Secondary | ICD-10-CM

## 2021-12-02 MED ORDER — ROSUVASTATIN CALCIUM 10 MG PO TABS: 10.0000 mg | ORAL_TABLET | Freq: Every day | ORAL | 3 refills | Status: DC

## 2021-12-02 NOTE — Telephone Encounter (Addendum)
Seen by patient Jake Shark on 11/29/2021  8:51 PM; orders placed and follow up mychart message to patient.    ----- Message from Loel Dubonnet, NP sent at 11/29/2021  8:34 PM EDT ----- Stable kidney function. Creatinine slightly elevated - ensure staying well hydrated. Normal electrolytes, liver function. LDL not at goal of <70 given known coronary artery disease. Add Crestor '10mg'$  daily with repeat FLP/LFT in 2 months.

## 2021-12-03 ENCOUNTER — Ambulatory Visit (HOSPITAL_COMMUNITY): Payer: Medicare PPO | Attending: Family

## 2021-12-03 DIAGNOSIS — I083 Combined rheumatic disorders of mitral, aortic and tricuspid valves: Secondary | ICD-10-CM | POA: Diagnosis not present

## 2021-12-03 DIAGNOSIS — I10 Essential (primary) hypertension: Secondary | ICD-10-CM | POA: Insufficient documentation

## 2021-12-03 DIAGNOSIS — E785 Hyperlipidemia, unspecified: Secondary | ICD-10-CM | POA: Insufficient documentation

## 2021-12-03 DIAGNOSIS — I471 Supraventricular tachycardia, unspecified: Secondary | ICD-10-CM | POA: Diagnosis not present

## 2021-12-03 DIAGNOSIS — R002 Palpitations: Secondary | ICD-10-CM | POA: Diagnosis not present

## 2021-12-03 LAB — ECHOCARDIOGRAM COMPLETE
Area-P 1/2: 3.08 cm2
MV M vel: 4.61 m/s
MV Peak grad: 85 mmHg
P 1/2 time: 588 msec
S' Lateral: 2.6 cm

## 2021-12-19 NOTE — Telephone Encounter (Signed)
Please advise 

## 2021-12-19 NOTE — Telephone Encounter (Signed)
May hold Crestor for 2 weeks then resume at '10mg'$  daily dose. If muscle aches recur after she re-starts ,let us know.   Loel Dubonnet, NP

## 2022-01-21 ENCOUNTER — Encounter (HOSPITAL_BASED_OUTPATIENT_CLINIC_OR_DEPARTMENT_OTHER): Payer: Self-pay | Admitting: Cardiovascular Disease

## 2022-01-21 ENCOUNTER — Ambulatory Visit: Payer: Medicare PPO | Admitting: Orthopedic Surgery

## 2022-01-21 ENCOUNTER — Ambulatory Visit (HOSPITAL_BASED_OUTPATIENT_CLINIC_OR_DEPARTMENT_OTHER): Payer: Medicare PPO | Admitting: Cardiovascular Disease

## 2022-01-21 VITALS — BP 148/68 | HR 59 | Ht 64.0 in | Wt 162.5 lb

## 2022-01-21 DIAGNOSIS — R002 Palpitations: Secondary | ICD-10-CM

## 2022-01-21 DIAGNOSIS — I10 Essential (primary) hypertension: Secondary | ICD-10-CM | POA: Diagnosis not present

## 2022-01-21 DIAGNOSIS — E782 Mixed hyperlipidemia: Secondary | ICD-10-CM

## 2022-01-21 DIAGNOSIS — E7800 Pure hypercholesterolemia, unspecified: Secondary | ICD-10-CM

## 2022-01-21 DIAGNOSIS — Z5181 Encounter for therapeutic drug level monitoring: Secondary | ICD-10-CM

## 2022-01-21 NOTE — Assessment & Plan Note (Addendum)
Blood pressure has been mostly 120-130s at home but at times into the 160s.  She will track her BP twice daily at home and call with updated BP values.  Continue amlodipine/benazepril at the current dose for now but we may need to increase to achieve her goal of <130/80.  Continue working on diet and exercise.  She notes that she is particularly salt sensitive and sensitive to stress and anxiety.

## 2022-01-21 NOTE — Assessment & Plan Note (Signed)
She has struggled with intermittent palpitations but they only last for a second or two.  These are likely her PVCs and PACs.  Given her baseline bradycardia we cannot start any nodal agents.  Continue to monitor.  Reassured her that unless she is having any alarm symptoms these are annoying and not dangerous.  Limit caffeine and stress.

## 2022-01-21 NOTE — Patient Instructions (Signed)
Medication Instructions:  Your physician recommends that you continue on your current medications as directed. Please refer to the Current Medication list given to you today.  *If you need a refill on your cardiac medications before your next appointment, please call your pharmacy*  Lab Work: Fasting LP?CMET soon   If you have labs (blood work) drawn today and your tests are completely normal, you will receive your results only by: [place] (if you have MyChart) OR A paper copy in the mail If you have any lab test that is abnormal or we need to change your treatment, we will call you to review the results.  Testing/Procedures: None   Follow-Up: At [facility], you and your health needs are our priority.  As part of our continuing mission to provide you with exceptional heart care, we have created designated Provider Care Teams.  These Care Teams include your primary Cardiologist (physician) and Advanced Practice Providers (APPs -  Physician Assistants and Nurse Practitioners) who all work together to provide you with the care you need, when you need it.  We recommend signing up for the patient portal called "MyChart".  Sign up information is provided on this After Visit Summary.  MyChart is used to connect with patients for Virtual Visits (Telemedicine).  Patients are able to view lab/test results, encounter notes, upcoming appointments, etc.  Non-urgent messages can be sent to your provider as well.   To learn more about what you can do with MyChart, go to [email or web address].    Your next appointment:   12 month(s)  The format for your next appointment:   In Person  Provider:   Skeet Latch, MD    Other Instructions MONITOR AND LOG YOUR BLOOD PRESSURE TWICE A DAY. SEND READINGS IN VIA MYCHART

## 2022-01-21 NOTE — Progress Notes (Signed)
**Note Lindsey Krause** Cardiology Office Note   Date:  01/21/2022   ID:  Lindsey Krause, DOB [birth year], MRN [id number]  PCP:  Lindsey Bill, MD  Cardiologist:   Lindsey Latch, MD   No chief complaint on file.    History of Present Illness: Lindsey Krause is a 71 y.o. female with nonobstructive CAD, aortic atherosclerosis, hypertension, GERD, hyperlipidemia, anxiety here for follow-up.  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  She was first seen 07/2021 for palpitations.  She started noticing palpitations 2-3 months prior to seeing her PCP.  She first noticed it when lifting weights. Lately it has been less frequent.  The episodes last for a few seconds and were most prominent for a  couple weeks.  She had no exertional symptoms.  She wore a 7-day monitor 07/2021 that revealed rare PACs and PVCs with 5 beats of NSVT and up to 8 beats of SVT.  She reported having whitecoat hypertension.  She wanted to work on diet and exercise and no changes were made to her antihypertensive regimen.  She had a coronary calcium score that revealed a score of 16.2 which was his 54th percentile for age and gender.  She also had aortic atherosclerosis.  She followed up with Lindsey Montana, NP 08/2021 and was not having any symptoms at the time.  She preferred to avoid medications and no changes were made.  She had an echo 08/2021 that revealed LVEF 60 to 65% with mild LVH and indeterminate diastolic function.  Pulmonary pressures are mildly elevated with a PASP of 40.2 mmHg.  Today, she is feeling overall well. She complains of palpitations, muscle aches, and severe pain in her right arm, to the point where she was not able to even hold a pencil. She believed that he muscle aches could possibly be attributed to an anti-fungal medication interacting with her rosuvastatin. As a result, she stopped taking her rosuvastatin. She has been feeling palpitations more frequently than before, about 2-3 times a day. She  has been exercising regularly again but has not been able to resume weight lifting yet. She has not been monitoring her blood pressure at home but reports that it tends to be in the 527-782 systolic range. She notes that she is sensitive to stress and anxiety and will increase her blood pressure. She has been trying to manage her salt intake. She denies having a history of smoking.She denies any palpitations, chest pain, shortness of breath, or peripheral edema. No lightheadedness, headaches, syncope, orthopnea, or PND.  Past Medical History:  Diagnosis Date   Anxiety    Colitis 12/02/2004   Dr Collene Mares "focal active colitis"   Hyperlipidemia    Hypertension    Palpitations 08/26/2021    Past Surgical History:  Procedure Laterality Date   COLONOSCOPY       Current Outpatient Medications  Medication Sig Dispense Refill   amlodipine-benazepril (LOTREL) 2.5-10 MG capsule Take 1 capsule by mouth daily.     Cholecalciferol (VITAMIN D) 125 MCG (5000 UT) CAPS Take 5,000 Units by mouth daily.     LORazepam (ATIVAN) 0.5 MG tablet TAKE ONE TABLET BY MOUTH TWICE A DAY AS NEEDED FOR ANXIETY     Probiotic Product (PROBIOTIC ADVANCED PO) Take by mouth.     rosuvastatin (CRESTOR) 10 MG tablet Take 1 tablet (10 mg total) by mouth daily. 90 tablet 3   Current Facility-Administered Medications  Medication Dose Route Frequency Provider Last Rate Last Admin  0.9 %  sodium chloride infusion  500 mL Intravenous Once Pyrtle, Lajuan Lines, MD        Allergies:   Benzoic acid, Aspirin, Codeine, Fish allergy, Grass extracts [gramineae pollens], Pollen extract, and Sulfites    Social History:  The patient  reports that she has quit smoking. She has never used smokeless tobacco. She reports that she does not drink alcohol and does not use drugs.   Family History:  The patient's family history includes Bradycardia in her father; Breast cancer in an other family member; Cerebral aneurysm in her sister; Colon cancer in  her cousin; Diabetes in her maternal grandmother and sister; Heart attack (age of onset: 33) in her father; Heart disease in her father; Hypertension in her brother, father, mother, and sister; Stroke in her brother.    ROS:  Please see the history of present illness.    (+) Palpitations (+) Muscle aches (+) Pain in her right arm Otherwise, review of systems are positive for none.   All other systems are reviewed and negative.    PHYSICAL EXAM: VS:  BP (!) 148/68 (BP Location: Right Arm, Patient Position: Sitting, Cuff Size: Large)   Pulse (!) 59   Ht '5\' 4"'$  (1.626 m)   Wt 162 lb 8 oz (73.7 kg)   SpO2 98%   BMI 27.89 kg/m  , BMI Body mass index is 27.89 kg/m. GENERAL:  Well appearing HEENT:  Pupils equal round and reactive, fundi not visualized, oral mucosa unremarkable NECK:  No jugular venous distention, waveform within normal limits, carotid upstroke brisk and symmetric, no bruits, no thyromegaly LUNGS:  Clear to auscultation bilaterally HEART:  RRR.  PMI not displaced or sustained,S1 and S2 within normal limits, no S3, no S4, no clicks, no rubs, no murmurs ABD:  Flat, positive bowel sounds normal in frequency in pitch, no bruits, no rebound, no guarding, no midline pulsatile mass, no hepatomegaly, no splenomegaly EXT:  2 plus pulses throughout, no edema, no cyanosis no clubbing SKIN:  No rashes no nodules NEURO:  Cranial nerves II through XII grossly intact, motor grossly intact throughout [facility]:  Cognitively intact, oriented to person place and time  Echo 12/03/2021: IMPRESSIONS   1. Left ventricular ejection fraction, by estimation, is 60 to 65%. Left  ventricular ejection fraction by 3D volume is 63 %. The left ventricle has  normal function. The left ventricle has no regional wall motion  abnormalities. There is mild left  ventricular hypertrophy. Left ventricular diastolic parameters are  indeterminate.   2. Right ventricular systolic function is normal. The right  ventricular  size is normal. There is mildly elevated pulmonary artery systolic  pressure. The estimated right ventricular systolic pressure is 89.3 mmHg.   3. The mitral valve is normal in structure. Mild mitral valve  regurgitation. No evidence of mitral stenosis.   4. Tricuspid valve regurgitation is moderate.   5. The aortic valve is tricuspid. Aortic valve regurgitation is mild. No  aortic stenosis is present.   6. The inferior vena cava is normal in size with greater than 50%  respiratory variability, suggesting right atrial pressure of 3 mmHg.   Calcium Score 08/22/2021: FINDINGS: Atherosclerotic calcifications in the thoracic aorta. Within the visualized portions of the thorax there are no suspicious appearing pulmonary nodules or masses, there is no acute consolidative airspace disease, no pleural effusions, no pneumothorax and no lymphadenopathy. Visualized portions of the upper abdomen are unremarkable. There are no aggressive appearing lytic or blastic lesions noted in  the visualized portions of the skeleton.   IMPRESSION: 1.  Aortic Atherosclerosis   EKG:  EKG is personally reviewed.  01/21/2022: EKG was not ordered. 07/15/2021: The ekg ordered today demonstrates sinus bradycardia.  Rate 52 bpm.  Incomplete RBBB.   Recent Labs: 11/28/2021: ALT 10; BUN 15; Creatinine, Ser 1.07; Potassium 4.5; Sodium 141    Lipid Panel    Component Value Date/Time   CHOL 181 11/28/2021 0948   TRIG 65 11/28/2021 0948   HDL 82 11/28/2021 0948   CHOLHDL 2.2 11/28/2021 0948   LDLCALC 87 11/28/2021 0948      Wt Readings from Last 3 Encounters:  01/21/22 162 lb 8 oz (73.7 kg)  08/16/21 157 lb (71.2 kg)  07/15/21 157 lb (71.2 kg)      ASSESSMENT AND PLAN:  Essential (primary) hypertension Blood pressure has been mostly 120-130s at home but at times into the 160s.  She will track her BP twice daily at home and call with updated BP values.  Continue amlodipine/benazepril at the  current dose for now but we may need to increase to achieve her goal of <130/80.  Continue working on diet and exercise.  She notes that she is particularly salt sensitive and sensitive to stress and anxiety.  Palpitations She has struggled with intermittent palpitations but they only last for a second or two.  These are likely her PVCs and PACs.  Given her baseline bradycardia we cannot start any nodal agents.  Continue to monitor.  Reassured her that unless she is having any alarm symptoms these are annoying and not dangerous.  Limit caffeine and stress.  Hypercholesterolemia Given that she does not have nonobstructive CAD, her LDL goal is less than 70.  She will resume her rosuvastatin at 5 mg and if she tolerates that we will increase it back to 10 mg.  Repeat lipids and a CMP in 3 months.   Current medicines are reviewed at length with the patient today.  The patient does not have concerns regarding medicines.  The following changes have been made: None  Labs/ tests ordered today include:   Orders Placed This Encounter  Procedures   Lipid panel   Comprehensive metabolic panel    Disposition:    FU with Rodina Pinales C. Oval Linsey, MD, [facility] in 1 year.   I,Rachel Rivera,acting as a Education administrator for Lindsey Latch, MD.,have documented all relevant documentation on the behalf of Lindsey Latch, MD,as directed by  Lindsey Latch, MD while in the presence of Lindsey Latch, MD.  I, [place] Oval Linsey, MD have reviewed all documentation for this visit.  The documentation of the exam, diagnosis, procedures, and orders on 01/21/2022 are all accurate and complete.   Signed, Zyionna Pesce C. Oval Linsey, MD, [facility]  01/21/2022 5:16 PM    [place]

## 2022-01-21 NOTE — Progress Notes (Deleted)
Cardiology Office Note   Date:  01/21/2022   ID:  Lindsey Krause, DOB [birth year], MRN [id number]  PCP:  Lindsey Bill, MD  Cardiologist:   Lindsey Latch, MD   No chief complaint on file.    History of Present Illness: Lindsey Krause is a 71 y.o. female with nonobstructive CAD, aortic atherosclerosis, hypertension, GERD, hyperlipidemia, anxiety here for follow-up.  She was first seen 07/2021 for palpitations.  She started noticing palpitations 2-3 months prior to seeing her PCP.  She first noticed it when lifting weights. Lately it has been less frequent.  The episodes last for a few seconds and were most prominent for a  couple weeks.  She had no exertional symptoms.  She wore a 7-day monitor 07/2021 that revealed rare PACs and PVCs with 5 beats of NSVT and up to 8 beats of SVT.  She reported having whitecoat hypertension.  She wanted to work on diet and exercise and no changes were made to her antihypertensive regimen.  She had a coronary calcium score that revealed a score of 16.2 which was his 54th percentile for age and gender.  She also had aortic atherosclerosis.  She followed up with Lindsey Montana, NP 08/2021 and was not having any symptoms at the time.  She preferred to avoid medications and no changes were made.  She had an echo 08/2021 that revealed LVEF 60 to 65% with mild LVH and indeterminate diastolic function.  Pulmonary pressures are mildly elevated with a PASP of 40.2 mmHg.   Lindsey Krause has been prescribed rosuvastatin but does not take it regularly due to side effects.  Past Medical History:  Diagnosis Date   Anxiety    Colitis 12/02/2004   Dr Collene Mares "focal active colitis"   Hyperlipidemia    Hypertension    Palpitations 08/26/2021    Past Surgical History:  Procedure Laterality Date   COLONOSCOPY       Current Outpatient Medications  Medication Sig Dispense Refill   amlodipine-benazepril (LOTREL) 2.5-10 MG capsule Take 1 capsule by mouth daily.      Cholecalciferol (VITAMIN D) 125 MCG (5000 UT) CAPS Take 5,000 Units by mouth daily.     LORazepam (ATIVAN) 0.5 MG tablet TAKE ONE TABLET BY MOUTH TWICE A DAY AS NEEDED FOR ANXIETY     Probiotic Product (PROBIOTIC ADVANCED PO) Take by mouth.     rosuvastatin (CRESTOR) 10 MG tablet Take 1 tablet (10 mg total) by mouth daily. 90 tablet 3   Current Facility-Administered Medications  Medication Dose Route Frequency Provider Last Rate Last Admin   0.9 %  sodium chloride infusion  500 mL Intravenous Once Pyrtle, Lajuan Lines, MD        Allergies:   Benzoic acid, Aspirin, Codeine, Fish allergy, Grass extracts [gramineae pollens], Pollen extract, and Sulfites    Social History:  The patient  reports that she has quit smoking. She has never used smokeless tobacco. She reports that she does not drink alcohol and does not use drugs.   Family History:  The patient's family history includes Bradycardia in her father; Breast cancer in an other family member; Cerebral aneurysm in her sister; Colon cancer in her cousin; Diabetes in her maternal grandmother and sister; Heart attack (age of onset: 3) in her father; Heart disease in her father; Hypertension in her brother, father, mother, and sister; Stroke in her brother.    ROS:  Please see the history of present illness.   Otherwise, review of systems are positive  for none.   All other systems are reviewed and negative.    PHYSICAL EXAM: VS:  There were no vitals taken for this visit. , BMI There is no height or weight on file to calculate BMI. GENERAL:  Well appearing HEENT:  Pupils equal round and reactive, fundi not visualized, oral mucosa unremarkable NECK:  No jugular venous distention, waveform within normal limits, carotid upstroke brisk and symmetric, no bruits, no thyromegaly LUNGS:  Clear to auscultation bilaterally HEART:  RRR.  PMI not displaced or sustained,S1 and S2 within normal limits, no S3, no S4, no clicks, no rubs, no murmurs ABD:  Flat,  positive bowel sounds normal in frequency in pitch, no bruits, no rebound, no guarding, no midline pulsatile mass, no hepatomegaly, no splenomegaly EXT:  2 plus pulses throughout, no edema, no cyanosis no clubbing SKIN:  No rashes no nodules NEURO:  Cranial nerves II through XII grossly intact, motor grossly intact throughout PSYCH:  Cognitively intact, oriented to person place and time  EKG:  EKG is ordered today. The ekg ordered today demonstrates sinus bradycardia.  Rate 52 bpm.  Incomplete RBBB.   Recent Labs: 11/28/2021: ALT 10; BUN 15; Creatinine, Ser 1.07; Potassium 4.5; Sodium 141    Lipid Panel    Component Value Date/Time   CHOL 181 11/28/2021 0948   TRIG 65 11/28/2021 0948   HDL 82 11/28/2021 0948   CHOLHDL 2.2 11/28/2021 0948   LDLCALC 87 11/28/2021 0948      Wt Readings from Last 3 Encounters:  08/16/21 157 lb (71.2 kg)  07/15/21 157 lb (71.2 kg)  02/01/21 156 lb 12.8 oz (71.1 kg)      ASSESSMENT AND PLAN:  No problem-specific Assessment & Plan notes found for this encounter.     Current medicines are reviewed at length with the patient today.  The patient does not have concerns regarding medicines.  The following changes have been made: None  Labs/ tests ordered today include:   No orders of the defined types were placed in this encounter.    Disposition:   FU with Rachael Zapanta C. Oval Linsey, MD, [facility] In 1 month.    Signed, Lindsey Krause C. Oval Linsey, MD, [facility]  01/21/2022 2:09 PM    [facility]

## 2022-01-21 NOTE — Assessment & Plan Note (Signed)
Given that she does not have nonobstructive CAD, her LDL goal is less than 70.  She will resume her rosuvastatin at 5 mg and if she tolerates that we will increase it back to 10 mg.  Repeat lipids and a CMP in 3 months.

## 2022-01-29 ENCOUNTER — Ambulatory Visit: Payer: Medicare PPO | Admitting: Podiatry

## 2022-01-29 DIAGNOSIS — M2142 Flat foot [pes planus] (acquired), left foot: Secondary | ICD-10-CM | POA: Diagnosis not present

## 2022-01-29 DIAGNOSIS — B351 Tinea unguium: Secondary | ICD-10-CM

## 2022-01-29 DIAGNOSIS — M76821 Posterior tibial tendinitis, right leg: Secondary | ICD-10-CM | POA: Diagnosis not present

## 2022-01-29 DIAGNOSIS — M2141 Flat foot [pes planus] (acquired), right foot: Secondary | ICD-10-CM

## 2022-01-29 DIAGNOSIS — M76822 Posterior tibial tendinitis, left leg: Secondary | ICD-10-CM

## 2022-01-29 MED ORDER — CICLOPIROX 8 % EX SOLN: Freq: Every day | CUTANEOUS | 0 refills | Status: DC

## 2022-01-30 NOTE — Progress Notes (Signed)
Of the  Subjective:  Patient ID: Lindsey Krause, female    DOB: [birth year],  MRN: [id number]  Chief Complaint  Patient presents with   Nail Problem    for follow up after nail fungus treatment.    71 y.o. female presents with the above complaint. History confirmed with patient.  Her nail fungus is doing much better she took the Lamisil she did end up getting myalgias towards the end of it and they took her off her statin which helped she is back on this now.  She is also having arch pain and pain through the bottom of the foot especially on the left when she exercises  Objective:  Physical Exam: warm, good capillary refill, no trophic changes or ulcerative lesions, normal DP and PT pulses, normal sensory exam, and she has onychomycosis with discoloration of multiple toenails, has about 75 to 85% clearance.  Bilaterally she has significant pes planus deformity with prominence of the navicular tuberosity on the left side with collapse of the medial longitudinal arch worse on the left, tenderness here to palpation along the posterior tibial tendon   Assessment:   1. Posterior tibial tendon dysfunction (PTTD) of both lower extremities   2. Onychomycosis   3. Pes planus of both feet      Plan:  Patient was evaluated and treated and all questions answered.  Her onychomycosis is doing much better and is nearly fully resolved.  Due to her side effects I would not recommend further oral therapy.  I recommended topical treatment with ciclopirox and an Rx for this was sent to her pharmacy.  She will use this until it completely resolves.   Regarding her fourth toe and arch pain I suspect likely secondary to his pes planus deformity.  Is primarily only tender when she is exercising its not tender on a daily basis.  We discussed treatment of this with a supportive longitudinal orthosis and I think she likely would benefit greatly from this.  She was casted for a custom molded orthosis today.  A  custom molded orthosis is necessary due to the clinical deformity present and asymmetry of the deformity.  She will be notified when these are ready and we will address them as needed  No follow-ups on file.

## 2022-03-10 ENCOUNTER — Telehealth: Payer: Self-pay | Admitting: Podiatry

## 2022-03-10 NOTE — Telephone Encounter (Signed)
Spoke with patient this morning about her orthotics coming in , it says that she has a balance on $490. She wants [place] to file an appeal with [facility] because it is medically necessary for her to have the orthotics for her to be able to walk?  Please advise

## 2022-03-14 ENCOUNTER — Ambulatory Visit (INDEPENDENT_AMBULATORY_CARE_PROVIDER_SITE_OTHER): Payer: Medicare PPO

## 2022-03-14 DIAGNOSIS — M2142 Flat foot [pes planus] (acquired), left foot: Secondary | ICD-10-CM

## 2022-03-14 DIAGNOSIS — M2141 Flat foot [pes planus] (acquired), right foot: Secondary | ICD-10-CM

## 2022-03-14 NOTE — Progress Notes (Signed)
Patient presents today to pick up custom molded foot orthotics recommended by Dr. Sherryle Lis.   Orthotics were dispensed and fit was satisfactory. Reviewed instructions for break-in and wear. Written instructions given to patient.  Patient will follow up as needed.   [facility] - order # [id number]

## 2022-05-24 LAB — COMPREHENSIVE METABOLIC PANEL
ALT: 11 IU/L (ref 0–32)
AST: 16 IU/L (ref 0–40)
Albumin/Globulin Ratio: 1.7 (ref 1.2–2.2)
Albumin: 3.9 g/dL (ref 3.9–4.9)
Alkaline Phosphatase: 98 IU/L (ref 44–121)
BUN/Creatinine Ratio: 11 — ABNORMAL LOW (ref 12–28)
BUN: 11 mg/dL (ref 8–27)
Bilirubin Total: 0.3 mg/dL (ref 0.0–1.2)
CO2: 23 mmol/L (ref 20–29)
Calcium: 9.4 mg/dL (ref 8.7–10.3)
Chloride: 107 mmol/L — ABNORMAL HIGH (ref 96–106)
Creatinine, Ser: 1.04 mg/dL — ABNORMAL HIGH (ref 0.57–1.00)
Globulin, Total: 2.3 g/dL (ref 1.5–4.5)
Glucose: 94 mg/dL (ref 70–99)
Potassium: 4.2 mmol/L (ref 3.5–5.2)
Sodium: 143 mmol/L (ref 134–144)
Total Protein: 6.2 g/dL (ref 6.0–8.5)
eGFR: 58 mL/min/{1.73_m2} — ABNORMAL LOW (ref >59)

## 2022-05-24 LAB — LIPID PANEL
Chol/HDL Ratio: 2 ratio (ref 0.0–4.4)
Cholesterol, Total: 156 mg/dL (ref 100–199)
HDL: 79 mg/dL (ref >39)
LDL Chol Calc (NIH): 67 mg/dL (ref 0–99)
Triglycerides: 46 mg/dL (ref 0–149)
VLDL Cholesterol Cal: 10 mg/dL (ref 5–40)

## 2022-09-02 ENCOUNTER — Encounter (HOSPITAL_BASED_OUTPATIENT_CLINIC_OR_DEPARTMENT_OTHER): Payer: Self-pay | Admitting: Cardiovascular Disease

## 2022-09-02 NOTE — Telephone Encounter (Signed)
Please advise 

## 2022-12-31 ENCOUNTER — Other Ambulatory Visit (HOSPITAL_BASED_OUTPATIENT_CLINIC_OR_DEPARTMENT_OTHER): Payer: Self-pay | Admitting: Family

## 2023-01-02 ENCOUNTER — Other Ambulatory Visit: Payer: Self-pay | Admitting: Family Medicine

## 2023-01-02 DIAGNOSIS — E041 Nontoxic single thyroid nodule: Secondary | ICD-10-CM

## 2023-01-22 ENCOUNTER — Ambulatory Visit (HOSPITAL_BASED_OUTPATIENT_CLINIC_OR_DEPARTMENT_OTHER): Payer: Medicare PPO | Admitting: Family

## 2023-01-22 ENCOUNTER — Telehealth (HOSPITAL_BASED_OUTPATIENT_CLINIC_OR_DEPARTMENT_OTHER): Payer: Self-pay

## 2023-01-22 ENCOUNTER — Encounter (HOSPITAL_BASED_OUTPATIENT_CLINIC_OR_DEPARTMENT_OTHER): Payer: Self-pay | Admitting: Family

## 2023-01-22 VITALS — BP 161/78 | HR 56 | Ht 64.0 in | Wt 150.0 lb

## 2023-01-22 DIAGNOSIS — R002 Palpitations: Secondary | ICD-10-CM | POA: Diagnosis not present

## 2023-01-22 DIAGNOSIS — I10 Essential (primary) hypertension: Secondary | ICD-10-CM

## 2023-01-22 NOTE — Progress Notes (Signed)
Cardiology Office Note:  .   Date:  01/22/2023  ID:  Mikeal Hawthorne, DOB April 12, 1950, MRN [id number] PCP: Laurann Montana, MD  [facility] Providers Cardiologist:  Chilton Si, MD    History of Present Illness: .   Lindsey Krause is a 72 y.o. female  with a hx of HTN, GERD, HLD, anxiety, PSVT, SVT, nonobstructive CAD, aortic atherosclerosis..   Seen by Dr. Duke Salvia 07/15/21 due to palpitations. No associated chest pain nor shortness of breath. Noted her BP was well controlled at home but often high in doctors offices. She was prescribed rosuvastatin but not taking. Subsequent monitor with NSR, up to 8 beats SVT and 5 beats of NSVT.  Coronary calcium score of 16.2 placing her in the 51st percentile for age, race, sex matched controls also noted aortic atherosclerosis.  She was last seen 01/21/2022 by Dr. Duke Salvia.  She was feeling overall well with blood pressure controlled in clinic and rarely elevated at home.  She is recommended to resume rosuvastatin 5 mg daily and if tolerated increase to 10 mg.  Lipid panel 05/23/2022 total cholesterol 156, HDL 79, LDL 67.  Presents today for follow up . Blood pressure has been high when checked at home. It has been more elevated over the last month with readings 150-170s. She has switched to a new PCP 3 weeks ago and notes BP was also elevated there.  Does being very salt sensitive and has had a couple meals were a bit higher in salt than her usual but has been working to cut back. Also notes more stressors as presently taking divinity courses online. She continues to exercise daily at the [facility]. Reports no shortness of breath nor dyspnea on exertion. Reports no chest pain, pressure, or tightness. No edema, orthopnea, PND. Reports no palpitations.    ROS: Please see the history of present illness.    All other systems reviewed and are negative.   Studies Reviewed: Marland Kitchen   EKG Interpretation Date/Time:  Thursday January 22 2023 14:03:15  EST Ventricular Rate:  56 PR Interval:  152 QRS Duration:  96 QT Interval:  426 QTC Calculation: 411 R Axis:   26  Text Interpretation: Sinus bradycardia Incomplete right bundle branch block stable compared to previous No acute changes Confirmed by Gillian Shields ([id number]) on 01/22/2023 2:12:28 PM    Cardiac Studies & Procedures       ECHOCARDIOGRAM  ECHOCARDIOGRAM COMPLETE 12/03/2021  Narrative ECHOCARDIOGRAM REPORT    Patient Name:   Lindsey Krause Date of Exam: 12/03/2021 Medical Rec #:  [id number]       Height:       64.0 in Accession #:    [id number]      Weight:       157.0 lb Date of Birth:  01-Oct-1950       BSA:          1.765 m Patient Age:    71 years        BP:           144/66 mmHg Patient Gender: F               HR:           55 bpm. Exam Location:  [address]  Procedure: 2D Echo, 3D Echo, Cardiac Doppler and Color Doppler  Indications:    I47.2 Ventricular Tachycardia  History:        Patient has no prior history of Echocardiogram examinations. Arrythmias:SVT, Signs/Symptoms:Palpitations; Risk Factors:Hypertension and HLD.  Sonographer:    Clearence Ped RCS Referring Phys: [phone] Lindsey Krause  IMPRESSIONS   1. Left ventricular ejection fraction, by estimation, is 60 to 65%. Left ventricular ejection fraction by 3D volume is 63 %. The left ventricle has normal function. The left ventricle has no regional wall motion abnormalities. There is mild left ventricular hypertrophy. Left ventricular diastolic parameters are indeterminate. 2. Right ventricular systolic function is normal. The right ventricular size is normal. There is mildly elevated pulmonary artery systolic pressure. The estimated right ventricular systolic pressure is 40.2 mmHg. 3. The mitral valve is normal in structure. Mild mitral valve regurgitation. No evidence of mitral stenosis. 4. Tricuspid valve regurgitation is moderate. 5. The aortic valve is tricuspid. Aortic valve regurgitation  is mild. No aortic stenosis is present. 6. The inferior vena cava is normal in size with greater than 50% respiratory variability, suggesting right atrial pressure of 3 mmHg.  FINDINGS Left Ventricle: Left ventricular ejection fraction, by estimation, is 60 to 65%. Left ventricular ejection fraction by 3D volume is 63 %. The left ventricle has normal function. The left ventricle has no regional wall motion abnormalities. The left ventricular internal cavity size was normal in size. There is mild left ventricular hypertrophy. Left ventricular diastolic parameters are indeterminate.  Right Ventricle: The right ventricular size is normal. No increase in right ventricular wall thickness. Right ventricular systolic function is normal. There is mildly elevated pulmonary artery systolic pressure. The tricuspid regurgitant velocity is 3.05 m/s, and with an assumed right atrial pressure of 3 mmHg, the estimated right ventricular systolic pressure is 40.2 mmHg.  Left Atrium: Left atrial size was normal in size.  Right Atrium: Right atrial size was normal in size.  Pericardium: There is no evidence of pericardial effusion.  Mitral Valve: The mitral valve is normal in structure. Mild mitral valve regurgitation. No evidence of mitral valve stenosis.  Tricuspid Valve: The tricuspid valve is normal in structure. Tricuspid valve regurgitation is moderate.  Aortic Valve: The aortic valve is tricuspid. Aortic valve regurgitation is mild. Aortic regurgitation PHT measures 588 msec. No aortic stenosis is present.  Pulmonic Valve: The pulmonic valve was grossly normal. Pulmonic valve regurgitation is trivial.  Aorta: The aortic root and ascending aorta are structurally normal, with no evidence of dilitation.  Venous: The inferior vena cava is normal in size with greater than 50% respiratory variability, suggesting right atrial pressure of 3 mmHg.  IAS/Shunts: The interatrial septum was not well  visualized.   LEFT VENTRICLE PLAX 2D LVIDd:         3.80 cm LVIDs:         2.60 cm LV PW:         0.90 cm         3D Volume EF LV IVS:        1.20 cm         LV 3D EF:    Left LVOT diam:     1.70 cm                      ventricul LV SV:         69                           ar LV SV Index:   39  ejection LVOT Area:     2.27 cm                     fraction by 3D volume is 63 %.  3D Volume EF: 3D EF:        63 % LV EDV:       124 ml LV ESV:       46 ml LV SV:        78 ml  RIGHT VENTRICLE RV Basal diam:  3.50 cm RV S prime:     14.70 cm/s TAPSE (M-mode): 2.3 cm RVSP:           40.2 mmHg  LEFT ATRIUM             Index        RIGHT ATRIUM           Index LA diam:        3.40 cm 1.93 cm/m   RA Pressure: 3.00 mmHg LA Vol (A2C):   44.3 ml 25.10 ml/m  RA Area:     15.20 cm LA Vol (A4C):   50.7 ml 28.72 ml/m  RA Volume:   37.20 ml  21.08 ml/m LA Biplane Vol: 49.9 ml 28.27 ml/m AORTIC VALVE LVOT Vmax:   123.00 cm/s LVOT Vmean:  81.000 cm/s LVOT VTI:    0.302 m AI PHT:      588 msec  AORTA Ao Root diam: 2.80 cm Ao Asc diam:  2.90 cm  MITRAL VALVE                TRICUSPID VALVE MV Area (PHT):              TR Peak grad:   37.2 mmHg MV Decel Time:              TR Vmax:        305.00 cm/s MR Peak grad: 85.0 mmHg     Estimated RAP:  3.00 mmHg MR Mean grad: 62.0 mmHg     RVSP:           40.2 mmHg MR Vmax:      461.00 cm/s MR Vmean:     380.0 cm/s    SHUNTS MV E velocity: 106.00 cm/s  Systemic VTI:  0.30 m MV A velocity: 115.00 cm/s  Systemic Diam: 1.70 cm MV E/A ratio:  0.92  Epifanio Lesches MD Electronically signed by Epifanio Lesches MD Signature Date/Time: 12/03/2021/6:01:43 PM    Final    MONITORS  LONG TERM MONITOR (3-14 DAYS) 07/30/2021  Narrative 5 Day Zio Monitor  Quality: Fair.  Baseline artifact. Predominant rhythm: Sinus rhythm Average heart rate: 64 bpm Max heart rate: 116 bpm Min heart rate: 45 bpm Pauses  >2.5 seconds: None  5 beats NSVT Up to 8 beats SVT Rare PACs and PVCs  Tiffany C. Duke Salvia, MD, [facility] 08/01/2021 11:57 AM   CT SCANS  CT CARDIAC SCORING (SELF PAY ONLY) 08/22/2021  Addendum 08/23/2021 10:40 AM ADDENDUM REPORT: 08/23/2021 10:38  CLINICAL DATA:  Cardiovascular Disease Risk stratification  EXAM: Coronary Calcium Score  TECHNIQUE: A gated, non-contrast computed tomography scan of the heart was performed using 3mm slice thickness. Axial images were analyzed on a dedicated workstation. Calcium scoring of the coronary arteries was performed using the Agatston method.  FINDINGS: Coronary arteries: Normal origins.  Coronary Calcium Score:  Left main: 0  Left anterior descending artery: 15.6  Left circumflex artery: 0.6  Right coronary artery: 0  Total: 16.2  Percentile: 51  Pericardium: Normal.  Aorta: Normal caliber of ascending aorta. Aortic atherosclerosis noted.  Non-cardiac: See separate report from [facility].  IMPRESSION: Coronary calcium score of 16.2. This was 51st percentile for age-, race-, and sex-matched controls. Aortic atherosclerosis.  RECOMMENDATIONS: Coronary artery calcium (CAC) score is a strong predictor of incident coronary heart disease (CHD) and provides predictive information beyond traditional risk factors. CAC scoring is reasonable to use in the decision to withhold, postpone, or initiate statin therapy in intermediate-risk or selected borderline-risk asymptomatic adults (age 27-75 years and LDL-C >=70 to <190 mg/dL) who do not have diabetes or established atherosclerotic cardiovascular disease (ASCVD).* In intermediate-risk (10-year ASCVD risk >=7.5% to <20%) adults or selected borderline-risk (10-year ASCVD risk >=5% to <7.5%) adults in whom a CAC score is measured for the purpose of making a treatment decision the following recommendations have been made:  If CAC=0, it is reasonable to withhold statin  therapy and reassess in 5 to 10 years, as long as higher risk conditions are absent (diabetes mellitus, family history of premature CHD in first degree relatives (males <55 years; females <65 years), cigarette smoking, or LDL >=190 mg/dL).  If CAC is 1 to 99, it is reasonable to initiate statin therapy for patients >=21 years of age.  If CAC is >=100 or >=75th percentile, it is reasonable to initiate statin therapy at any age.  Cardiology referral should be considered for patients with CAC scores >=400 or >=75th percentile.  *2018 AHA/ACC/AACVPR/AAPA/ABC/ACPM/ADA/AGS/APhA/ASPC/NLA/PCNA Guideline on the Management of Blood Cholesterol: A Report of the American College of Cardiology/American Heart Association Task Force on Clinical Practice Guidelines. J Am Coll Cardiol. 2019;73(24):3168-3209.  Jodelle Red, MD   Electronically Signed By: Jodelle Red M.D. On: 08/23/2021 10:38  Narrative EXAM: OVER-READ INTERPRETATION  CT CHEST  The following report is a limited chest CT over-read performed by radiologist Dr. Trudie Reed of [facility] on 08/22/2021. The coronary calcium score interpretation by the cardiologist is attached.  COMPARISON:  None Available.  FINDINGS: Atherosclerotic calcifications in the thoracic aorta. Within the visualized portions of the thorax there are no suspicious appearing pulmonary nodules or masses, there is no acute consolidative airspace disease, no pleural effusions, no pneumothorax and no lymphadenopathy. Visualized portions of the upper abdomen are unremarkable. There are no aggressive appearing lytic or blastic lesions noted in the visualized portions of the skeleton.  IMPRESSION: 1.  Aortic Atherosclerosis (ICD10-I70.0).  Electronically Signed: By: Trudie Reed M.D. On: 08/23/2021 08:06          Risk Assessment/Calculations:     HYPERTENSION CONTROL Vitals:   01/22/23 1351 01/22/23 1405   BP: (!) 156/69 (!) 161/78    The patient's blood pressure is elevated above target today.  In order to address the patient's elevated BP: A current anti-hypertensive medication was adjusted today.; Follow up with general cardiology has been recommended.          Physical Exam:   VS:  BP (!) 161/78 (BP Location: Left Arm, Patient Position: Sitting)   Pulse (!) 56   Ht 5\' 4"  (1.626 m)   Wt 150 lb (68 kg)   SpO2 98%   BMI 25.75 kg/m    Wt Readings from Last 3 Encounters:  01/22/23 150 lb (68 kg)  01/21/22 162 lb 8 oz (73.7 kg)  08/16/21 157 lb (71.2 kg)    GEN: Well nourished, well developed in no acute distress NECK: No JVD; No carotid bruits CARDIAC: RRR, no murmurs,  rubs, gallops RESPIRATORY:  Clear to auscultation without rales, wheezing or rhonchi  ABDOMEN: Soft, non-tender, non-distended EXTREMITIES:  No edema; No deformity   ASSESSMENT AND PLAN: .    HTN - BP not goal <130/80. Increase Amlodipine-Benazperil to two of her 2.5-10mg  tablet daily. Will check in via MyChart in a week and if BP not at goal and further up-titrate. BMP today as she has been intermittently taking an extra Amlodipine-Benazepril.   Palpitations - occasional palpitations which are overall not bothersome. Defer AV nodal blocking agent as she is baseline bradycardic.        Dispo: follow up in 2-3 months   Signed, Alver Sorrow, NP

## 2023-01-22 NOTE — Patient Instructions (Addendum)
Medication Instructions:  CHANGE Amlodipine-Benazepril to two tablets daily  *If you need a refill on your cardiac medications before your next appointment, please call your pharmacy*  Lab Work: BMP today  If you have labs (blood work) drawn today and your tests are completely normal, you will receive your results only by: MyChart Message (if you have MyChart) OR A paper copy in the mail If you have any lab test that is abnormal or we need to change your treatment, we will call you to review the results.   Follow-Up: At [facility], you and your health needs are our priority.  As part of our continuing mission to provide you with exceptional heart care, we have created designated Provider Care Teams.  These Care Teams include your primary Cardiologist (physician) and Advanced Practice Providers (APPs -  Physician Assistants and Nurse Practitioners) who all work together to provide you with the care you need, when you need it.  We recommend signing up for the patient portal called "MyChart".  Sign up information is provided on this After Visit Summary.  MyChart is used to connect with patients for Virtual Visits (Telemedicine).  Patients are able to view lab/test results, encounter notes, upcoming appointments, etc.  Non-urgent messages can be sent to your provider as well.   To learn more about what you can do with MyChart, go to [email or web address].    Your next appointment:   2-3 month(s)  [facility]  Provider:   Gillian Shields, NP    Other Instructions We will send you a MyChart message to check on your blood pressures in about 2 weeks!

## 2023-01-23 LAB — BASIC METABOLIC PANEL
BUN/Creatinine Ratio: 11 — ABNORMAL LOW (ref 12–28)
BUN: 11 mg/dL (ref 8–27)
CO2: 23 mmol/L (ref 20–29)
Calcium: 10 mg/dL (ref 8.7–10.3)
Chloride: 103 mmol/L (ref 96–106)
Creatinine, Ser: 1.01 mg/dL — ABNORMAL HIGH (ref 0.57–1.00)
Glucose: 95 mg/dL (ref 70–99)
Potassium: 4.4 mmol/L (ref 3.5–5.2)
Sodium: 141 mmol/L (ref 134–144)
eGFR: 60 mL/min/{1.73_m2} (ref >59)

## 2023-01-23 NOTE — Telephone Encounter (Signed)
Sent MyChart message following up with pt's question within office visit.

## 2023-01-26 ENCOUNTER — Telehealth (HOSPITAL_BASED_OUTPATIENT_CLINIC_OR_DEPARTMENT_OTHER): Payer: Self-pay

## 2023-01-26 NOTE — Telephone Encounter (Signed)
Called patient and discussed lab results. She verbalized understanding and was thankful for call.   ----- Message from Alver Sorrow sent at 01/23/2023  2:25 PM EST ----- Stable kidney function. Normal electrolytes. Good result!

## 2023-01-26 NOTE — Telephone Encounter (Signed)
-----   Message from Alver Sorrow sent at 01/23/2023  2:25 PM EST ----- Stable kidney function. Normal electrolytes. Good result!

## 2023-02-18 ENCOUNTER — Ambulatory Visit
Admission: RE | Admit: 2023-02-18 | Discharge: 2023-02-18 | Disposition: A | Discharge status: Home / Self Care | Payer: Medicare PPO | Source: Ambulatory Visit | Attending: Family Medicine | Admitting: Family Medicine

## 2023-02-18 DIAGNOSIS — E041 Nontoxic single thyroid nodule: Secondary | ICD-10-CM

## 2023-11-19 ENCOUNTER — Encounter (HOSPITAL_BASED_OUTPATIENT_CLINIC_OR_DEPARTMENT_OTHER): Payer: Self-pay | Admitting: Family

## 2023-11-19 ENCOUNTER — Ambulatory Visit (HOSPITAL_BASED_OUTPATIENT_CLINIC_OR_DEPARTMENT_OTHER): Admitting: Family

## 2023-11-19 VITALS — BP 132/60 | HR 54 | Resp 17 | Ht 64.0 in | Wt 157.0 lb

## 2023-11-19 DIAGNOSIS — I10 Essential (primary) hypertension: Secondary | ICD-10-CM | POA: Diagnosis not present

## 2023-11-19 MED ORDER — AMLODIPINE BESY-BENAZEPRIL HCL 2.5-10 MG PO CAPS: 1.0000 | ORAL_CAPSULE | Freq: Every day | ORAL | 1 refills | Status: AC

## 2023-11-19 NOTE — Patient Instructions (Addendum)
 Medication Instructions:  Continue Amlodipine -Benazperil 2.5-10mg  daily    Follow-Up: Please follow up in 6 months in [facility] CLIN[facility] with Dr. Raford, Reche Finder, NP or Allean Mink PharmD

## 2023-11-19 NOTE — Progress Notes (Signed)
 Cardiology Office Note:  .   Date:  11/19/2023  ID:  Lindsey Krause, DOB 1950/04/24, MRN [id number] PCP: Teresa Channel, MD  [facility] Providers Cardiologist:  Annabella Scarce, MD    History of Present Illness: .   Lindsey Krause is a 73 y.o. female  with a hx of HTN, GERD, HLD, anxiety, PSVT, SVT, nonobstructive CAD, aortic atherosclerosis..   Seen by Dr. Scarce 07/15/21 due to palpitations. No associated chest pain nor shortness of breath. Noted her BP was well controlled at home but often high in doctors offices. She was prescribed rosuvastatin  but not taking. Subsequent monitor with NSR, up to 8 beats SVT and 5 beats of NSVT.  Coronary calcium  score of 16.2 placing her in the 51st percentile for age, race, sex matched controls also noted aortic atherosclerosis.  She was last seen 01/21/2022 by Dr. Scarce.  She was feeling overall well with blood pressure controlled in clinic and rarely elevated at home.  She is recommended to resume rosuvastatin  5 mg daily and if tolerated increase to 10 mg.  Lipid panel 05/23/2022 total cholesterol 156, HDL 79, LDL 67.  She was last in 01/22/2023.  BP was not at goal less than 130/80.  Amlodipine -benazepril  increased to 2 tablets of 2.5-10 mg daily for total daily dose of 5-20 mg daily.  She was recommended for follow-up in 2 to 3 months but has not been seen since that time.  Per chart review at PCP visit 07/06/2023 due to poor BP control HCTZ 12.5 mg daily added.  She was recommended to continue amlodipine -benazepril  2.5-10 mg twice daily.  Presents today for follow up . Occasional dizziness described as spinning particularly with allergies - discussed likely etiology vertigo which she has struggled with previous. She has been checking blood pressure at home, chief concern of low diastolic BP with readings in the 40-50s. She has adjusted to taking her blood pressure medications during the day. Reports during the day her BP has been better  controlled with one tablet of Amlodipine -Benazperil 2.5-10mg  daily. She is not taking hydrochlorothiazide as prescribed by PCP. Still taking divinity  courses online. She exercises daily. Endorses eating home and out but following a low salt diet.    ROS: Please see the history of present illness.    All other systems reviewed and are negative.   Studies Reviewed: .        Cardiac Studies & Procedures   ______________________________________________________________________________________________     ECHOCARDIOGRAM  ECHOCARDIOGRAM COMPLETE 12/03/2021  Narrative ECHOCARDIOGRAM REPORT    Patient Name:   Lindsey Krause Date of Exam: 12/03/2021 Medical Rec #:  [id number]       Height:       64.0 in Accession #:    [id number]      Weight:       157.0 lb Date of Birth:  07/08/1950       BSA:          1.765 m Patient Age:    73 years        BP:           144/66 mmHg Patient Gender: F               HR:           55 bpm. Exam Location:  [address]  Procedure: 2D Echo, 3D Echo, Cardiac Doppler and Color Doppler  Indications:    I47.2 Ventricular Tachycardia  History:        Patient has  no prior history of Echocardiogram examinations. Arrythmias:SVT, Signs/Symptoms:Palpitations; Risk Factors:Hypertension and HLD.  Sonographer:    Waldo Guadalajara RCS Referring Phys: [id number] Andilyn Bettcher S Saphyra Hutt  IMPRESSIONS   1. Left ventricular ejection fraction, by estimation, is 60 to 65%. Left ventricular ejection fraction by 3D volume is 63 %. The left ventricle has normal function. The left ventricle has no regional wall motion abnormalities. There is mild left ventricular hypertrophy. Left ventricular diastolic parameters are indeterminate. 2. Right ventricular systolic function is normal. The right ventricular size is normal. There is mildly elevated pulmonary artery systolic pressure. The estimated right ventricular systolic pressure is 40.2 mmHg. 3. The mitral valve is normal in structure.  Mild mitral valve regurgitation. No evidence of mitral stenosis. 4. Tricuspid valve regurgitation is moderate. 5. The aortic valve is tricuspid. Aortic valve regurgitation is mild. No aortic stenosis is present. 6. The inferior vena cava is normal in size with greater than 50% respiratory variability, suggesting right atrial pressure of 3 mmHg.  FINDINGS Left Ventricle: Left ventricular ejection fraction, by estimation, is 60 to 65%. Left ventricular ejection fraction by 3D volume is 63 %. The left ventricle has normal function. The left ventricle has no regional wall motion abnormalities. The left ventricular internal cavity size was normal in size. There is mild left ventricular hypertrophy. Left ventricular diastolic parameters are indeterminate.  Right Ventricle: The right ventricular size is normal. No increase in right ventricular wall thickness. Right ventricular systolic function is normal. There is mildly elevated pulmonary artery systolic pressure. The tricuspid regurgitant velocity is 3.05 m/s, and with an assumed right atrial pressure of 3 mmHg, the estimated right ventricular systolic pressure is 40.2 mmHg.  Left Atrium: Left atrial size was normal in size.  Right Atrium: Right atrial size was normal in size.  Pericardium: There is no evidence of pericardial effusion.  Mitral Valve: The mitral valve is normal in structure. Mild mitral valve regurgitation. No evidence of mitral valve stenosis.  Tricuspid Valve: The tricuspid valve is normal in structure. Tricuspid valve regurgitation is moderate.  Aortic Valve: The aortic valve is tricuspid. Aortic valve regurgitation is mild. Aortic regurgitation PHT measures 588 msec. No aortic stenosis is present.  Pulmonic Valve: The pulmonic valve was grossly normal. Pulmonic valve regurgitation is trivial.  Aorta: The aortic root and ascending aorta are structurally normal, with no evidence of dilitation.  Venous: The inferior vena cava  is normal in size with greater than 50% respiratory variability, suggesting right atrial pressure of 3 mmHg.  IAS/Shunts: The interatrial septum was not well visualized.   LEFT VENTRICLE PLAX 2D LVIDd:         3.80 cm LVIDs:         2.60 cm LV PW:         0.90 cm         3D Volume EF LV IVS:        1.20 cm         LV 3D EF:    Left LVOT diam:     1.70 cm                      ventricul LV SV:         69                           ar LV SV Index:   39  ejection LVOT Area:     2.27 cm                     fraction by 3D volume is 63 %.  3D Volume EF: 3D EF:        63 % LV EDV:       124 ml LV ESV:       46 ml LV SV:        78 ml  RIGHT VENTRICLE RV Basal diam:  3.50 cm RV S prime:     14.70 cm/s TAPSE (M-mode): 2.3 cm RVSP:           40.2 mmHg  LEFT ATRIUM             Index        RIGHT ATRIUM           Index LA diam:        3.40 cm 1.93 cm/m   RA Pressure: 3.00 mmHg LA Vol (A2C):   44.3 ml 25.10 ml/m  RA Area:     15.20 cm LA Vol (A4C):   50.7 ml 28.72 ml/m  RA Volume:   37.20 ml  21.08 ml/m LA Biplane Vol: 49.9 ml 28.27 ml/m AORTIC VALVE LVOT Vmax:   123.00 cm/s LVOT Vmean:  81.000 cm/s LVOT VTI:    0.302 m AI PHT:      588 msec  AORTA Ao Root diam: 2.80 cm Ao Asc diam:  2.90 cm  MITRAL VALVE                TRICUSPID VALVE MV Area (PHT):              TR Peak grad:   37.2 mmHg MV Decel Time:              TR Vmax:        305.00 cm/s MR Peak grad: 85.0 mmHg     Estimated RAP:  3.00 mmHg MR Mean grad: 62.0 mmHg     RVSP:           40.2 mmHg MR Vmax:      461.00 cm/s MR Vmean:     380.0 cm/s    SHUNTS MV E velocity: 106.00 cm/s  Systemic VTI:  0.30 m MV A velocity: 115.00 cm/s  Systemic Diam: 1.70 cm MV E/A ratio:  0.92  Lonni Nanas MD Electronically signed by Lonni Nanas MD Signature Date/Time: 12/03/2021/6:01:43 PM    Final    MONITORS  LONG TERM MONITOR (3-14 DAYS) 07/30/2021  Narrative 5 Day Zio  Monitor  Quality: Fair.  Baseline artifact. Predominant rhythm: Sinus rhythm Average heart rate: 64 bpm Max heart rate: 116 bpm Min heart rate: 45 bpm Pauses >2.5 seconds: None  5 beats NSVT Up to 8 beats SVT Rare PACs and PVCs  Tiffany C. Raford, MD, [facility] 08/01/2021 11:57 AM   CT SCANS  CT CARDIAC SCORING (SELF PAY ONLY) 08/22/2021  Addendum 08/23/2021 10:40 AM ADDENDUM REPORT: 08/23/2021 10:38  CLINICAL DATA:  Cardiovascular Disease Risk stratification  EXAM: Coronary Calcium  Score  TECHNIQUE: A gated, non-contrast computed tomography scan of the heart was performed using 3mm slice thickness. Axial images were analyzed on a dedicated workstation. Calcium  scoring of the coronary arteries was performed using the Agatston method.  FINDINGS: Coronary arteries: Normal origins.  Coronary Calcium  Score:  Left main: 0  Left anterior descending artery: 15.6  Left circumflex artery: 0.6  Right coronary artery: 0  Total: 16.2  Percentile: 51  Pericardium: Normal.  Aorta: Normal caliber of ascending aorta. Aortic atherosclerosis noted.  Non-cardiac: See separate report from [facility].  IMPRESSION: Coronary calcium  score of 16.2. This was 51st percentile for age-, race-, and sex-matched controls. Aortic atherosclerosis.  RECOMMENDATIONS: Coronary artery calcium  (CAC) score is a strong predictor of incident coronary heart disease (CHD) and provides predictive information beyond traditional risk factors. CAC scoring is reasonable to use in the decision to withhold, postpone, or initiate statin therapy in intermediate-risk or selected borderline-risk asymptomatic adults (age 13-75 years and LDL-C >=70 to <190 mg/dL) who do not have diabetes or established atherosclerotic cardiovascular disease (ASCVD).* In intermediate-risk (10-year ASCVD risk >=7.5% to <20%) adults or selected borderline-risk (10-year ASCVD risk >=5% to <7.5%) adults in whom a CAC  score is measured for the purpose of making a treatment decision the following recommendations have been made:  If CAC=0, it is reasonable to withhold statin therapy and reassess in 5 to 10 years, as long as higher risk conditions are absent (diabetes mellitus, family history of premature CHD in first degree relatives (males <55 years; females <65 years), cigarette smoking, or LDL >=190 mg/dL).  If CAC is 1 to 99, it is reasonable to initiate statin therapy for patients >=72 years of age.  If CAC is >=100 or >=75th percentile, it is reasonable to initiate statin therapy at any age.  Cardiology referral should be considered for patients with CAC scores >=400 or >=75th percentile.  *2018 AHA/ACC/AACVPR/AAPA/ABC/ACPM/ADA/AGS/APhA/ASPC/NLA/PCNA Guideline on the Management of Blood Cholesterol: A Report of the American College of Cardiology/American Heart Association Task Force on Clinical Practice Guidelines. J Am Coll Cardiol. 2019;73(24):3168-3209.  Shelda Bruckner, MD   Electronically Signed By: Shelda Bruckner M.D. On: 08/23/2021 10:38  Narrative EXAM: OVER-READ INTERPRETATION  CT CHEST  The following report is a limited chest CT over-read performed by radiologist Dr. Toribio Aye of [facility] on 08/22/2021. The coronary calcium  score interpretation by the cardiologist is attached.  COMPARISON:  None Available.  FINDINGS: Atherosclerotic calcifications in the thoracic aorta. Within the visualized portions of the thorax there are no suspicious appearing pulmonary nodules or masses, there is no acute consolidative airspace disease, no pleural effusions, no pneumothorax and no lymphadenopathy. Visualized portions of the upper abdomen are unremarkable. There are no aggressive appearing lytic or blastic lesions noted in the visualized portions of the skeleton.  IMPRESSION: 1.  Aortic Atherosclerosis (ICD10-I70.0).  Electronically  Signed: By: Toribio Aye M.D. On: 08/23/2021 08:06     ______________________________________________________________________________________________        Risk Assessment/Calculations:             Physical Exam:   VS:  BP 132/60 (BP Location: Left Arm, Patient Position: Sitting, Cuff Size: Normal)   Pulse (!) 54   Resp 17   Ht 5' 4 (1.626 m)   Wt 157 lb (71.2 kg)   SpO2 98%   BMI 26.95 kg/m    Wt Readings from Last 3 Encounters:  11/19/23 157 lb (71.2 kg)  01/22/23 150 lb (68 kg)  01/21/22 162 lb 8 oz (73.7 kg)    GEN: Well nourished, well developed in no acute distress NECK: No JVD; No carotid bruits CARDIAC: RRR, no murmurs, rubs, gallops RESPIRATORY:  Clear to auscultation without rales, wheezing or rhonchi  ABDOMEN: Soft, non-tender, non-distended EXTREMITIES:  No edema; No deformity   ASSESSMENT AND PLAN: .    HTN - BP reasonably controlled on Amlodipine -Benazepril  2.5-10. May take additional  tablet if SBP more than 170. Discussed to monitor BP at home at least 2 hours after medications and sitting for 5-10 minutes. Discussed to monitor BP at home at least 2 hours after medications and sitting for 5-10 minutes. Recommend aiming for 150 minutes of moderate intensity activity per week and following a heart healthy diet.    Palpitations - No recurrent palpitations.  Defer AV nodal blocking agent as she is baseline bradycardic.        Dispo: follow up in 6 months   Signed, Reche GORMAN Finder, NP
